# Patient Record
Sex: Female | Born: 1995
Health system: Southern US, Community
[De-identification: ages and names within clinical notes are randomized; demographics above are authoritative.]

## PROBLEM LIST (undated history)

## (undated) DIAGNOSIS — R7989 Other specified abnormal findings of blood chemistry: Secondary | ICD-10-CM

## (undated) DIAGNOSIS — E440 Moderate protein-calorie malnutrition: Secondary | ICD-10-CM

## (undated) DIAGNOSIS — R630 Anorexia: Secondary | ICD-10-CM

## (undated) DIAGNOSIS — B20 Human immunodeficiency virus [HIV] disease: Secondary | ICD-10-CM

## (undated) DIAGNOSIS — Z21 Asymptomatic human immunodeficiency virus [HIV] infection status: Secondary | ICD-10-CM

## (undated) DIAGNOSIS — R945 Abnormal results of liver function studies: Secondary | ICD-10-CM

## (undated) DIAGNOSIS — D649 Anemia, unspecified: Secondary | ICD-10-CM

## (undated) DIAGNOSIS — K8000 Calculus of gallbladder with acute cholecystitis without obstruction: Secondary | ICD-10-CM

## (undated) HISTORY — DX: Moderate protein-calorie malnutrition: E44.0

## (undated) HISTORY — PX: CYST REMOVAL HAND: SHX6279

## (undated) HISTORY — DX: Asymptomatic human immunodeficiency virus (hiv) infection status: Z21

## (undated) HISTORY — DX: Anorexia: R63.0

## (undated) HISTORY — DX: Calculus of gallbladder with acute cholecystitis without obstruction: K80.00

## (undated) HISTORY — DX: Human immunodeficiency virus (HIV) disease: B20

---

## 2017-06-21 ENCOUNTER — Encounter: Payer: Self-pay | Admitting: Emergency Medicine

## 2017-06-21 ENCOUNTER — Emergency Department
Admission: EM | Admit: 2017-06-21 | Discharge: 2017-06-21 | Disposition: A | Discharge status: Home / Self Care | Payer: Medicaid Other | Attending: Emergency Medicine | Admitting: Emergency Medicine

## 2017-06-21 DIAGNOSIS — R51 Headache: Secondary | ICD-10-CM | POA: Diagnosis present

## 2017-06-21 DIAGNOSIS — R6889 Other general symptoms and signs: Secondary | ICD-10-CM | POA: Diagnosis not present

## 2017-06-21 DIAGNOSIS — R110 Nausea: Secondary | ICD-10-CM | POA: Diagnosis not present

## 2017-06-21 LAB — COMPREHENSIVE METABOLIC PANEL
ALT: 14 U/L (ref 14–54)
ANION GAP: 8 (ref 5–15)
AST: 26 U/L (ref 15–41)
Albumin: 4.6 g/dL (ref 3.5–5.0)
Alkaline Phosphatase: 53 U/L (ref 38–126)
BUN: 8 mg/dL (ref 6–20)
CHLORIDE: 106 mmol/L (ref 101–111)
CO2: 24 mmol/L (ref 22–32)
Calcium: 9 mg/dL (ref 8.9–10.3)
Creatinine, Ser: 0.87 mg/dL (ref 0.44–1.00)
GFR calc non Af Amer: >60 mL/min (ref >60)
Glucose, Bld: 94 mg/dL (ref 65–99)
POTASSIUM: 3.7 mmol/L (ref 3.5–5.1)
SODIUM: 138 mmol/L (ref 135–145)
Total Bilirubin: 0.5 mg/dL (ref 0.3–1.2)
Total Protein: 8.9 g/dL — ABNORMAL HIGH (ref 6.5–8.1)

## 2017-06-21 LAB — CBC WITH DIFFERENTIAL/PLATELET
Basophils Absolute: 0 10*3/uL (ref 0–0.1)
Basophils Relative: 0 %
EOS ABS: 0 10*3/uL (ref 0–0.7)
EOS PCT: 0 %
HCT: 38.5 % (ref 35.0–47.0)
Hemoglobin: 13.2 g/dL (ref 12.0–16.0)
LYMPHS ABS: 1.4 10*3/uL (ref 1.0–3.6)
Lymphocytes Relative: 34 %
MCH: 28.5 pg (ref 26.0–34.0)
MCHC: 34.4 g/dL (ref 32.0–36.0)
MCV: 82.8 fL (ref 80.0–100.0)
Monocytes Absolute: 0.4 10*3/uL (ref 0.2–0.9)
Monocytes Relative: 9 %
Neutro Abs: 2.3 10*3/uL (ref 1.4–6.5)
Neutrophils Relative %: 57 %
PLATELETS: 156 10*3/uL (ref 150–440)
RBC: 4.65 MIL/uL (ref 3.80–5.20)
RDW: 13.4 % (ref 11.5–14.5)
WBC: 4.1 10*3/uL (ref 3.6–11.0)

## 2017-06-21 LAB — POCT PREGNANCY, URINE: PREG TEST UR: NEGATIVE

## 2017-06-21 LAB — MONONUCLEOSIS SCREEN: Mono Screen: NEGATIVE

## 2017-06-21 LAB — POCT RAPID STREP A: STREPTOCOCCUS, GROUP A SCREEN (DIRECT): NEGATIVE

## 2017-06-21 MED ORDER — KETOROLAC TROMETHAMINE 10 MG PO TABS: 10.0000 mg | ORAL_TABLET | Freq: Four times a day (QID) | ORAL | 0 refills | Status: DC | PRN

## 2017-06-21 MED ORDER — KETOROLAC TROMETHAMINE 30 MG/ML IJ SOLN
15.0000 mg | Freq: Once | INTRAMUSCULAR | Status: AC
  Administered 2017-06-21: 15 mg via INTRAVENOUS
  Filled 2017-06-21: qty 1

## 2017-06-21 MED ORDER — ONDANSETRON HCL 4 MG/2ML IJ SOLN
INTRAMUSCULAR | Status: AC
  Administered 2017-06-21: 4 mg via INTRAVENOUS
  Filled 2017-06-21: qty 2

## 2017-06-21 MED ORDER — SODIUM CHLORIDE 0.9 % IV BOLUS (SEPSIS)
1000.0000 mL | Freq: Once | INTRAVENOUS | Status: AC
  Administered 2017-06-21: 1000 mL via INTRAVENOUS

## 2017-06-21 MED ORDER — ONDANSETRON HCL 4 MG/2ML IJ SOLN
4.0000 mg | Freq: Once | INTRAMUSCULAR | Status: AC
  Administered 2017-06-21: 4 mg via INTRAVENOUS

## 2017-06-21 MED ORDER — ONDANSETRON 4 MG PO TBDP: 4.0000 mg | ORAL_TABLET | Freq: Three times a day (TID) | ORAL | 0 refills | Status: DC | PRN

## 2017-06-21 NOTE — ED Notes (Addendum)
Pt. States she has been having a migraine for about a week with generalized pain all over her body radiating to the back. Pt. Describes pain like pins and needles. Pt. c/o nausea. Zofran given.

## 2017-06-21 NOTE — Discharge Instructions (Signed)
You may take tylenol in addition to the prescription medications if needed for body aches or headache. Follow up with the primary care provider for symptoms that are not improving over the next few days. Return to the ER for symptoms that change or worsen if unable to schedule an appointment.

## 2017-06-21 NOTE — ED Triage Notes (Signed)
Pt c/o headache, generalized body aches and chills since Wednesday.  Pt also c/o slight cough. Pt taking ibuprofen OTC.  Denies any nausea, vomiting or light sensitivity at this time.

## 2017-06-21 NOTE — ED Provider Notes (Signed)
[facility] Emergency Department Provider Note  ____________________________________________  Time seen: Approximately 7:34 AM  I have reviewed the triage vital signs and the nursing notes.   HISTORY  Chief Complaint Generalized Body Aches and Headache   HPI Monica Henson is a 21 y.o. female who presents to the emergency department for treatment and evaluation of 5 days of headache, generalized body aches, chills, and low-grade fever intermittently. Yesterday, she developed a sore throat. Headache is temporal and occipital. She denies history of migraines. She has no significant past medical history. No relief of symptoms with ibuprofen. She stopped her OC 2 days ago at the recommendation by someone at work who told her it could be the cause of her headache. She denies visual changes.   History reviewed. No pertinent past medical history.  There are no active problems to display for this patient.   Past Surgical History:  Procedure Laterality Date  . CYST REMOVAL HAND      Prior to Admission medications   Medication Sig Start Date End Date Taking? Authorizing Provider  ketorolac (TORADOL) 10 MG tablet Take 1 tablet (10 mg total) by mouth every 6 (six) hours as needed. 06/21/17   Renesme Kerrigan B, FNP  ondansetron (ZOFRAN-ODT) 4 MG disintegrating tablet Take 1 tablet (4 mg total) by mouth every 8 (eight) hours as needed for nausea or vomiting. 06/21/17   Chinita Pester, FNP    Allergies Patient has no known allergies.  History reviewed. No pertinent family history.  Social History Social History  Substance Use Topics  . Smoking status: Current Every Day Smoker    Packs/day: 0.50    Types: Cigarettes  . Smokeless tobacco: Never Used  . Alcohol use Not on file    Review of Systems Constitutional: Positive for fever/chills ENT: Positive for sore throat. Cardiovascular: Denies chest pain. Respiratory: Negative for shortness of breath. Positive for  cough. Gastrointestinal: Positive for nausea,  Negative for vomiting.  Negative for diarrhea. Positive for constipation. Musculoskeletal: Positive for body aches Skin: Negative for rash. Neurological: Positive for headaches ____________________________________________   PHYSICAL EXAM:  VITAL SIGNS: ED Triage Vitals  Enc Vitals Group     BP 06/21/17 0719 102/62     Pulse Rate 06/21/17 0719 84     Resp 06/21/17 0719 16     Temp 06/21/17 0719 98.7 F (37.1 C)     Temp Source 06/21/17 0719 Oral     SpO2 06/21/17 0719 99 %     Weight 06/21/17 0720 140 lb (63.5 kg)     Height 06/21/17 0720 5\' 6"  (1.676 m)     Head Circumference --      Peak Flow --      Pain Score 06/21/17 0722 8     Pain Loc --      Pain Edu? --      Excl. in GC? --     Constitutional: Alert and oriented. Acutely ill appearing and in no acute distress. Eyes: Conjunctivae are normal. EOMI. Ears: Bilateral tympanic membranes are normal, intact, without erythema or loss of light reflex. Nose: No congestion noted; no rhinnorhea. Mouth/Throat: Mucous membranes are moist.  Oropharynx mildly erythematous. Tonsils are 1+ without exudate. Neck: No stridor.  Lymphatic: No cervical lymphadenopathy. Cardiovascular: Normal rate, regular rhythm. Good peripheral circulation. Respiratory: Normal respiratory effort.  No retractions. Breath sounds clear to auscultation throughout.. Gastrointestinal: Soft and nontender.  Musculoskeletal: FROM x 4 extremities.  Neurologic:  Normal speech and language.  Skin:  Skin is warm, dry and intact. No rash noted. Psychiatric: Mood and affect are normal. Speech and behavior are normal.  ____________________________________________   LABS (all labs ordered are listed, but only abnormal results are displayed)  Labs Reviewed  COMPREHENSIVE METABOLIC PANEL - Abnormal; Notable for the following:       Result Value   Total Protein 8.9 (*)    All other components within normal limits   CBC WITH DIFFERENTIAL/PLATELET  MONONUCLEOSIS SCREEN  POC URINE PREG, ED  POCT PREGNANCY, URINE  POCT RAPID STREP A   ____________________________________________  EKG  Not indicated ____________________________________________  RADIOLOGY  Not indicated ____________________________________________   PROCEDURES  Procedure(s) performed: None  Critical Care performed: No ____________________________________________   INITIAL IMPRESSION / ASSESSMENT AND PLAN / ED COURSE  21 year old female presenting to the emergency department for multiple medical complaints. Symptoms are likely viral. She will initially be treated with IV fluids and Zofran while awaiting results of lab studies.  ----------------------------------------- 9:06 AM on 06/21/2017 ----------------------------------------- Nausea relieved. Patient and mother updated on lab results. Mono screen still pending. This is likely a viral illness. She continues to complain of headache. She will be given toradol IV. ----------------------------------------- 9:58 AM on 06/21/2017 -----------------------------------------  Patient discharged home. She reported significant decrease in her headache after the Toradol. She had no return of nausea and had no vomiting while in the department. She will be given prescriptions for Zofran and Toradol. She was encouraged to follow-up with the primary care provider for choice for symptoms that are not improving over the next 2-3 days. She was instructed to return to the emergency department for symptoms that change or worsen if she is unable to schedule an appointment.  Pertinent labs & imaging results that were available during my care of the patient were reviewed by me and considered in my medical decision making (see chart for details).  Discharge Medication List as of 06/21/2017  9:48 AM    START taking these medications   Details  ketorolac (TORADOL) 10 MG tablet Take 1 tablet  (10 mg total) by mouth every 6 (six) hours as needed., Starting Sun 06/21/2017, Print    ondansetron (ZOFRAN-ODT) 4 MG disintegrating tablet Take 1 tablet (4 mg total) by mouth every 8 (eight) hours as needed for nausea or vomiting., Starting Sun 06/21/2017, Print        If controlled substance prescribed during this visit, 12 month history viewed on the [place] prior to issuing an initial prescription for Schedule II or III opiod. ____________________________________________   FINAL CLINICAL IMPRESSION(S) / ED DIAGNOSES  Final diagnoses:  Flu-like symptoms    Note:  This document was prepared using Dragon voice recognition software and may include unintentional dictation errors.     Chinita Pesterriplett, Daqwan Dougal B, FNP 06/21/17 [id number]    Phineas SemenGoodman, Graydon, MD 06/21/17 1122

## 2017-11-16 ENCOUNTER — Encounter: Payer: Self-pay | Admitting: Emergency Medicine

## 2017-11-16 ENCOUNTER — Emergency Department
Admission: EM | Admit: 2017-11-16 | Discharge: 2017-11-16 | Disposition: A | Discharge status: Home / Self Care | Payer: Medicaid Other | Attending: Emergency Medicine | Admitting: Emergency Medicine

## 2017-11-16 ENCOUNTER — Other Ambulatory Visit: Payer: Self-pay

## 2017-11-16 DIAGNOSIS — G44209 Tension-type headache, unspecified, not intractable: Secondary | ICD-10-CM | POA: Insufficient documentation

## 2017-11-16 DIAGNOSIS — R51 Headache: Secondary | ICD-10-CM | POA: Diagnosis present

## 2017-11-16 DIAGNOSIS — H73893 Other specified disorders of tympanic membrane, bilateral: Secondary | ICD-10-CM | POA: Insufficient documentation

## 2017-11-16 DIAGNOSIS — Z87891 Personal history of nicotine dependence: Secondary | ICD-10-CM | POA: Insufficient documentation

## 2017-11-16 DIAGNOSIS — Z79899 Other long term (current) drug therapy: Secondary | ICD-10-CM | POA: Diagnosis not present

## 2017-11-16 DIAGNOSIS — H6593 Unspecified nonsuppurative otitis media, bilateral: Secondary | ICD-10-CM

## 2017-11-16 MED ORDER — BUTALBITAL-APAP-CAFFEINE 50-325-40 MG PO TABS: 1.0000 | ORAL_TABLET | Freq: Two times a day (BID) | ORAL | 0 refills | Status: AC | PRN

## 2017-11-16 MED ORDER — PREDNISONE 50 MG PO TABS: ORAL_TABLET | ORAL | 0 refills | Status: DC

## 2017-11-16 NOTE — ED Provider Notes (Signed)
[facility] Emergency Department Provider Note  ____________________________________________  Time seen: Approximately 4:44 PM  I have reviewed the triage vital signs and the nursing notes.   HISTORY  Chief Complaint Headache and Facial Pain    HPI Monica Henson is a 22 y.o. female presents to the emergency department with occipital headache that has occurred for approximately 2 weeks.  Patient reports that she has experienced increased stress.  Patient recently moved from [place] to [place].  Patient reports that she has been spending increased time sleeping on her days off.  She denies a history of depression, suicidal ideation or homicidal ideation.  Patient secondarily reports muffled hearing from the right ear.  She denies rhinorrhea, congestion, nonproductive cough, chills, nausea, vomiting abdominal pain.  No alleviating measures of been attempted.   History reviewed. No pertinent past medical history.  There are no active problems to display for this patient.   Past Surgical History:  Procedure Laterality Date  . CYST REMOVAL HAND      Prior to Admission medications   Medication Sig Start Date End Date Taking? Authorizing Provider  butalbital-acetaminophen-caffeine (FIORICET, ESGIC) [phone] MG tablet Take 1-2 tablets by mouth 2 (two) times daily as needed for up to 10 days for headache. 11/16/17 11/26/17  Orvil Feil, PA-C  ketorolac (TORADOL) 10 MG tablet Take 1 tablet (10 mg total) by mouth every 6 (six) hours as needed. 06/21/17   Triplett, Cari B, FNP  ondansetron (ZOFRAN-ODT) 4 MG disintegrating tablet Take 1 tablet (4 mg total) by mouth every 8 (eight) hours as needed for nausea or vomiting. 06/21/17   Triplett, Cari B, FNP  predniSONE (DELTASONE) 50 MG tablet Take one 50 mg tablet once a day for 5 days. 11/16/17   Orvil Feil, PA-C    Allergies Patient has no known allergies.  History reviewed. No pertinent family history.  Social  History Social History   Tobacco Use  . Smoking status: Former Smoker    Packs/day: 0.50    Types: Cigarettes  . Smokeless tobacco: Never Used  Substance Use Topics  . Alcohol use: No    Frequency: Never  . Drug use: No     Review of Systems  Constitutional: No fever.  Eyes: No visual changes. No discharge ENT: Patient has muffled hearing, right.  Cardiovascular: no chest pain. Respiratory: no cough. No SOB. Gastrointestinal: No nausea.  Neurological: Patient has occipital headache.   ____________________________________________   PHYSICAL EXAM:  VITAL SIGNS: ED Triage Vitals  Enc Vitals Group     BP 11/16/17 1551 109/70     Pulse Rate 11/16/17 1553 87     Resp 11/16/17 1551 16     Temp 11/16/17 1551 100.1 F (37.8 C)     Temp Source 11/16/17 1551 Oral     SpO2 11/16/17 1553 98 %     Weight 11/16/17 1553 140 lb (63.5 kg)     Height 11/16/17 1553 5\' 6"  (1.676 m)     Head Circumference --      Peak Flow --      Pain Score 11/16/17 1553 7     Pain Loc --      Pain Edu? --      Excl. in GC? --      Constitutional: Patient is sitting in the chair when I walk in.  Patient laughs after each question. Eyes: Conjunctivae are normal. PERRL. EOMI. Head: Atraumatic.  Patient has no maxillary or frontal sinus tenderness. ENT:  Ears: TMs are effused bilaterally.      Nose: No congestion/rhinnorhea.      Mouth/Throat: Mucous membranes are moist.  Posterior pharynx is not erythematous.  Uvula is midline. Neck: No stridor.  No cervical spine tenderness to palpation.  Patient has no tenderness to palpation of cervical paraspinal muscles. Cardiovascular: Normal rate, regular rhythm. Normal S1 and S2.  Good peripheral circulation. Respiratory: Normal respiratory effort without tachypnea or retractions. Lungs CTAB. Good air entry to the bases with no decreased or absent breath sounds. Musculoskeletal: Full range of motion to all extremities. No gross deformities  appreciated.   ____________________________________________   LABS (all labs ordered are listed, but only abnormal results are displayed)  Labs Reviewed - No data to display ____________________________________________  EKG   ____________________________________________  RADIOLOGY  No results found.  ____________________________________________    PROCEDURES  Procedure(s) performed:    Procedures    Medications - No data to display   ____________________________________________   INITIAL IMPRESSION / ASSESSMENT AND PLAN / ED COURSE  Pertinent labs & imaging results that were available during my care of the patient were reviewed by me and considered in my medical decision making (see chart for details).  Review of the [place] CSRS was performed in accordance of the [facility] prior to dispensing any controlled drugs.    Assessment and plan Differential diagnosis includes tension headache and middle ear effusion. Patient presents to the emergency department with occipital headache and muffled hearing from the right ear.  On physical exam, patient had an effused right tympanic membrane.  History is consistent with tension headache.  Patient was discharged with Fioricet and prednisone.  Patient was advised against operating heavy machinery while taking Fioricet.  She was advised to follow-up with primary care as needed.  All patient questions were answered.    ____________________________________________  FINAL CLINICAL IMPRESSION(S) / ED DIAGNOSES  Final diagnoses:  Tension headache  Fluid level behind tympanic membrane of both ears      NEW MEDICATIONS STARTED DURING THIS VISIT:  ED Discharge Orders        Ordered    predniSONE (DELTASONE) 50 MG tablet     11/16/17 1635    butalbital-acetaminophen-caffeine (FIORICET, ESGIC) 50-325-40 MG tablet  2 times daily PRN     11/16/17 1635          This chart was dictated using voice recognition software/Dragon.  Despite best efforts to proofread, errors can occur which can change the meaning. Any change was purely unintentional.    Orvil FeilWoods, Jadira Nierman M, PA-C 11/16/17 1650    Jeanmarie PlantMcShane, James A, MD 11/16/17 2013

## 2017-11-16 NOTE — ED Notes (Signed)
See triage note  Presents with pain to right side of head   Describes as "tightness'   Also having some pressure to right ear and at her nose  No fever or drainage  afebrile on arrival

## 2017-11-16 NOTE — ED Triage Notes (Addendum)
Here for what pt thinks is sinus infection. Has had some nasal congestion and feels like something stuck in there can't get out.  Right ear also stopped up.  Pain to maxillary and frontal sinuses along with headache.  Reports does not have primary. Tried excedrin and it helps but then headache comes up. Appears well. Ambulatory. NAD. Low grade temp

## 2017-11-28 ENCOUNTER — Emergency Department: Payer: Medicaid Other

## 2017-11-28 ENCOUNTER — Other Ambulatory Visit: Payer: Self-pay

## 2017-11-28 ENCOUNTER — Encounter: Payer: Self-pay | Admitting: Emergency Medicine

## 2017-11-28 ENCOUNTER — Emergency Department
Admission: EM | Admit: 2017-11-28 | Discharge: 2017-11-28 | Disposition: A | Discharge status: Home / Self Care | Payer: Medicaid Other | Attending: Emergency Medicine | Admitting: Emergency Medicine

## 2017-11-28 DIAGNOSIS — M25551 Pain in right hip: Secondary | ICD-10-CM | POA: Diagnosis present

## 2017-11-28 DIAGNOSIS — M25552 Pain in left hip: Secondary | ICD-10-CM | POA: Insufficient documentation

## 2017-11-28 DIAGNOSIS — M25559 Pain in unspecified hip: Secondary | ICD-10-CM

## 2017-11-28 DIAGNOSIS — Z87891 Personal history of nicotine dependence: Secondary | ICD-10-CM | POA: Insufficient documentation

## 2017-11-28 LAB — URINALYSIS, COMPLETE (UACMP) WITH MICROSCOPIC
Bacteria, UA: NONE SEEN
Bilirubin Urine: NEGATIVE
GLUCOSE, UA: NEGATIVE mg/dL
Hgb urine dipstick: NEGATIVE
KETONES UR: NEGATIVE mg/dL
Leukocytes, UA: NEGATIVE
Nitrite: NEGATIVE
PH: 7 (ref 5.0–8.0)
Protein, ur: NEGATIVE mg/dL
Specific Gravity, Urine: 1.018 (ref 1.005–1.030)

## 2017-11-28 LAB — POCT PREGNANCY, URINE: Preg Test, Ur: NEGATIVE

## 2017-11-28 MED ORDER — ONDANSETRON 8 MG PO TBDP: 8.0000 mg | ORAL_TABLET | Freq: Once | ORAL | Status: DC

## 2017-11-28 MED ORDER — ONDANSETRON 4 MG PO TBDP
ORAL_TABLET | ORAL | Status: AC
  Administered 2017-11-28: 4 mg via ORAL
  Filled 2017-11-28: qty 1

## 2017-11-28 MED ORDER — ONDANSETRON 4 MG PO TBDP
4.0000 mg | ORAL_TABLET | Freq: Once | ORAL | Status: AC
  Administered 2017-11-28: 4 mg via ORAL

## 2017-11-28 MED ORDER — HYDROMORPHONE HCL 1 MG/ML IJ SOLN
1.0000 mg | Freq: Once | INTRAMUSCULAR | Status: AC
  Administered 2017-11-28: 1 mg via INTRAMUSCULAR
  Filled 2017-11-28: qty 1

## 2017-11-28 MED ORDER — CYCLOBENZAPRINE HCL 10 MG PO TABS: 10.0000 mg | ORAL_TABLET | Freq: Three times a day (TID) | ORAL | 0 refills | Status: DC | PRN

## 2017-11-28 MED ORDER — IBUPROFEN 600 MG PO TABS: 600.0000 mg | ORAL_TABLET | Freq: Three times a day (TID) | ORAL | 0 refills | Status: DC | PRN

## 2017-11-28 MED ORDER — KETOROLAC TROMETHAMINE 60 MG/2ML IM SOLN
30.0000 mg | Freq: Once | INTRAMUSCULAR | Status: AC
  Administered 2017-11-28: 30 mg via INTRAMUSCULAR
  Filled 2017-11-28: qty 2

## 2017-11-28 MED ORDER — TRAMADOL HCL 50 MG PO TABS: 50.0000 mg | ORAL_TABLET | Freq: Four times a day (QID) | ORAL | 0 refills | Status: DC | PRN

## 2017-11-28 NOTE — ED Notes (Signed)
Pt states her pain is better but she feels slightly nauseated now with medication, PA notified and order received for zofran.

## 2017-11-28 NOTE — ED Notes (Signed)
Friend here to drive pt home, pt reports feeling ready to go

## 2017-11-28 NOTE — ED Triage Notes (Signed)
Low back pain radiating to both legs began yesterday. Denies injury. Denies history of same.

## 2017-11-28 NOTE — ED Notes (Signed)
Pt states she just woke up yesterday with lower back pain that radiates into hips and down to knees, no known injury, denies any urinary symptoms, fever or other problems

## 2017-11-28 NOTE — ED Provider Notes (Signed)
[facility] Emergency Department Provider Note   ____________________________________________   None    (approximate)  I have reviewed the triage vital signs and the nursing notes.   HISTORY  Chief Complaint Back Pain    HPI Monica Henson is a 22 y.o. female patient complained of bilateral hip pain radiating to her knees.  Patient denies provocative incident for complaint.  Onset of a.m. awakening yesterday.  Patient rates pain as a 10/10.  Patient described the pain is "aching".  No pulses measured for complaint.  Patient said pain is less when she sits in the bed with the knees flexed.  Patient stated left lateral upper back increases the pain.  Patient denies bladder bowel dysfunction.  History reviewed. No pertinent past medical history.  There are no active problems to display for this patient.   Past Surgical History:  Procedure Laterality Date  . CYST REMOVAL HAND      Prior to Admission medications   Medication Sig Start Date End Date Taking? Authorizing Provider  cyclobenzaprine (FLEXERIL) 10 MG tablet Take 1 tablet (10 mg total) by mouth 3 (three) times daily as needed. 11/28/17   Joni Reining, PA-C  ibuprofen (ADVIL,MOTRIN) 600 MG tablet Take 1 tablet (600 mg total) by mouth every 8 (eight) hours as needed. 11/28/17   Joni Reining, PA-C  ketorolac (TORADOL) 10 MG tablet Take 1 tablet (10 mg total) by mouth every 6 (six) hours as needed. 06/21/17   Triplett, Cari B, FNP  ondansetron (ZOFRAN-ODT) 4 MG disintegrating tablet Take 1 tablet (4 mg total) by mouth every 8 (eight) hours as needed for nausea or vomiting. 06/21/17   Triplett, Cari B, FNP  predniSONE (DELTASONE) 50 MG tablet Take one 50 mg tablet once a day for 5 days. 11/16/17   Orvil Feil, PA-C  traMADol (ULTRAM) 50 MG tablet Take 1 tablet (50 mg total) by mouth every 6 (six) hours as needed for moderate pain. 11/28/17   Joni Reining, PA-C    Allergies Patient has no known  allergies.  No family history on file.  Social History Social History   Tobacco Use  . Smoking status: Former Smoker    Packs/day: 0.50    Types: Cigarettes  . Smokeless tobacco: Never Used  Substance Use Topics  . Alcohol use: No    Frequency: Never  . Drug use: No    Review of Systems Constitutional: No fever/chills Eyes: No visual changes. ENT: No sore throat. Cardiovascular: Denies chest pain. Respiratory: Denies shortness of breath. Gastrointestinal: No abdominal pain.  No nausea, no vomiting.  No diarrhea.  No constipation. Genitourinary: Negative for dysuria. Musculoskeletal: Bilateral hip and leg pain Skin: Negative for rash. Neurological: Negative for headaches, focal weakness or numbness.   ____________________________________________   PHYSICAL EXAM:  VITAL SIGNS: ED Triage Vitals  Enc Vitals Group     BP 11/28/17 0944 115/83     Pulse Rate 11/28/17 0944 97     Resp 11/28/17 0944 (!) 8     Temp 11/28/17 0944 98.5 F (36.9 C)     Temp Source 11/28/17 0944 Oral     SpO2 11/28/17 0944 100 %     Weight 11/28/17 0945 140 lb (63.5 kg)     Height 11/28/17 0945 5\' 6"  (1.676 m)     Head Circumference --      Peak Flow --      Pain Score 11/28/17 0945 10     Pain Loc --  Pain Edu? --      Excl. in GC? --     Constitutional: Alert and oriented. Well appearing and in no acute distress. Cardiovascular: Normal rate, regular rhythm. Grossly normal heart sounds.  Good peripheral circulation. Respiratory: Normal respiratory effort.  No retractions. Lungs CTAB. Gastrointestinal: Soft and nontender. No distention. No abdominal bruits. No CVA tenderness. Musculoskeletal: No obvious hip deformity.  No leg length discrepancy.  No lower extremity tenderness nor edema.  No joint effusions. Neurologic:  Normal speech and language. No gross focal neurologic deficits are appreciated. No gait instability. Skin:  Skin is warm, dry and intact. No rash  noted. Psychiatric: Mood and affect are normal. Speech and behavior are normal.  ____________________________________________   LABS (all labs ordered are listed, but only abnormal results are displayed)  Labs Reviewed  URINALYSIS, COMPLETE (UACMP) WITH MICROSCOPIC - Abnormal; Notable for the following components:      Result Value   Color, Urine YELLOW (*)    APPearance HAZY (*)    Squamous Epithelial / LPF 0-5 (*)    All other components within normal limits  POCT PREGNANCY, URINE  POC URINE PREG, ED   ____________________________________________  EKG   ____________________________________________  RADIOLOGY  ED MD interpretation: No acute findings on x-ray of the pelvic.  Official radiology report(s): Dg Pelvis 1-2 Views  Result Date: 11/28/2017 CLINICAL DATA:  22 year old female with sacral pain. No known injury. EXAM: PELVIS - 1-2 VIEW COMPARISON:  None. FINDINGS: Transitional anatomy on the left with partial sacralization of L5. Sacroiliac joints appear unremarkable. No evidence of fracture or malalignment. Normal bony mineralization. No lytic or blastic osseous lesion. The visualized bowel gas pattern is unremarkable. IMPRESSION: 1. No acute abnormality. 2. 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 Electronically Signed   By: Malachy MoanHeath  McCullough M.D.   On: 11/28/2017 11:30    ____________________________________________   PROCEDURES  Procedure(s) performed: None  Procedures  Critical Care performed: No  ____________________________________________   INITIAL IMPRESSION / ASSESSMENT AND PLAN / ED COURSE  As part of my medical decision making, I reviewed the following data within the electronic MEDICAL RECORD NUMBER    Muscle skeletal pain of the pelvic.  Discussed negative x-ray findings with patient.  Take medication as directed.  Patient given discharge care instructions and advised [facility]      ____________________________________________   FINAL CLINICAL IMPRESSION(S) / ED DIAGNOSES  Final diagnoses:  Hip pain, bilateral     ED Discharge Orders        Ordered    traMADol (ULTRAM) 50 MG tablet  Every 6 hours PRN     11/28/17 1153    cyclobenzaprine (FLEXERIL) 10 MG tablet  3 times daily PRN     11/28/17 1153    ibuprofen (ADVIL,MOTRIN) 600 MG tablet  Every 8 hours PRN     11/28/17 1153       Note:  This document was prepared using Dragon voice recognition software and may include unintentional dictation errors.    Joni ReiningSmith, Arrow Emmerich K, PA-C 11/28/17 1158    Governor RooksLord, Rebecca, MD 11/28/17 1335

## 2017-11-30 ENCOUNTER — Other Ambulatory Visit: Payer: Self-pay

## 2017-11-30 ENCOUNTER — Emergency Department
Admission: EM | Admit: 2017-11-30 | Discharge: 2017-11-30 | Disposition: A | Discharge status: Home / Self Care | Payer: Medicaid Other | Attending: Emergency Medicine | Admitting: Emergency Medicine

## 2017-11-30 DIAGNOSIS — M25551 Pain in right hip: Secondary | ICD-10-CM | POA: Diagnosis not present

## 2017-11-30 DIAGNOSIS — M25552 Pain in left hip: Secondary | ICD-10-CM | POA: Diagnosis not present

## 2017-11-30 DIAGNOSIS — R112 Nausea with vomiting, unspecified: Secondary | ICD-10-CM | POA: Insufficient documentation

## 2017-11-30 DIAGNOSIS — Z5321 Procedure and treatment not carried out due to patient leaving prior to being seen by health care provider: Secondary | ICD-10-CM | POA: Diagnosis not present

## 2017-11-30 LAB — CBC
HEMATOCRIT: 33.2 % — AB (ref 35.0–47.0)
HEMOGLOBIN: 10.9 g/dL — AB (ref 12.0–16.0)
MCH: 26.6 pg (ref 26.0–34.0)
MCHC: 32.9 g/dL (ref 32.0–36.0)
MCV: 80.8 fL (ref 80.0–100.0)
Platelets: 140 10*3/uL — ABNORMAL LOW (ref 150–440)
RBC: 4.11 MIL/uL (ref 3.80–5.20)
RDW: 12.2 % (ref 11.5–14.5)
WBC: 2.8 10*3/uL — ABNORMAL LOW (ref 3.6–11.0)

## 2017-11-30 LAB — BASIC METABOLIC PANEL
Anion gap: 7 (ref 5–15)
BUN: 11 mg/dL (ref 6–20)
CHLORIDE: 102 mmol/L (ref 101–111)
CO2: 27 mmol/L (ref 22–32)
Calcium: 8.6 mg/dL — ABNORMAL LOW (ref 8.9–10.3)
Creatinine, Ser: 0.77 mg/dL (ref 0.44–1.00)
GFR calc Af Amer: >60 mL/min (ref >60)
GFR calc non Af Amer: >60 mL/min (ref >60)
Glucose, Bld: 89 mg/dL (ref 65–99)
POTASSIUM: 3.2 mmol/L — AB (ref 3.5–5.1)
SODIUM: 136 mmol/L (ref 135–145)

## 2017-11-30 MED ORDER — ACETAMINOPHEN 325 MG PO TABS: 650.0000 mg | ORAL_TABLET | Freq: Once | ORAL | Status: DC | PRN

## 2017-11-30 NOTE — ED Notes (Signed)
Pt called in waiting room with no answer.  

## 2017-11-30 NOTE — ED Triage Notes (Signed)
Pt c/o bilateral hip pain for which she was seen and treated x 2 days ago in ED. Pt does not have PCP or insurance and states she was unable to follow up. Pt states the medcations that were rx'd for her were not helping the pain and were making her nauseated and vomit. Pt staes her hips hurt most when she lays down. Pt states leasst amount of pain when walking.

## 2017-11-30 NOTE — ED Notes (Signed)
Pt called with no answer

## 2017-11-30 NOTE — ED Notes (Signed)
Called pt to inform her of Cari-Beth, PA's concerns with abnormal lab value and recommendation she return to the ED for evaluation after LWBS, but got no answer at the only number provided. HIPPA complaint message left for pt to return call to ED ASAP.

## 2017-12-01 ENCOUNTER — Encounter: Payer: Self-pay | Admitting: Emergency Medicine

## 2017-12-01 ENCOUNTER — Telehealth: Payer: Self-pay | Admitting: Emergency Medicine

## 2017-12-01 ENCOUNTER — Emergency Department
Admission: EM | Admit: 2017-12-01 | Discharge: 2017-12-01 | Disposition: A | Discharge status: Home / Self Care | Payer: Medicaid Other | Attending: Emergency Medicine | Admitting: Emergency Medicine

## 2017-12-01 ENCOUNTER — Emergency Department: Payer: Medicaid Other

## 2017-12-01 DIAGNOSIS — Z87891 Personal history of nicotine dependence: Secondary | ICD-10-CM | POA: Diagnosis not present

## 2017-12-01 DIAGNOSIS — Z79899 Other long term (current) drug therapy: Secondary | ICD-10-CM | POA: Diagnosis not present

## 2017-12-01 DIAGNOSIS — M5442 Lumbago with sciatica, left side: Secondary | ICD-10-CM | POA: Diagnosis not present

## 2017-12-01 DIAGNOSIS — D72819 Decreased white blood cell count, unspecified: Secondary | ICD-10-CM

## 2017-12-01 DIAGNOSIS — M5441 Lumbago with sciatica, right side: Secondary | ICD-10-CM | POA: Diagnosis not present

## 2017-12-01 LAB — CBC WITH DIFFERENTIAL/PLATELET
BASOS PCT: 0 %
Basophils Absolute: 0 10*3/uL (ref 0–0.1)
EOS ABS: 0 10*3/uL (ref 0–0.7)
Eosinophils Relative: 0 %
HEMATOCRIT: 31.3 % — AB (ref 35.0–47.0)
Hemoglobin: 10.3 g/dL — ABNORMAL LOW (ref 12.0–16.0)
Lymphocytes Relative: 34 %
Lymphs Abs: 1 10*3/uL (ref 1.0–3.6)
MCH: 26.5 pg (ref 26.0–34.0)
MCHC: 33 g/dL (ref 32.0–36.0)
MCV: 80.4 fL (ref 80.0–100.0)
MONOS PCT: 12 %
Monocytes Absolute: 0.4 10*3/uL (ref 0.2–0.9)
NEUTROS ABS: 1.6 10*3/uL (ref 1.4–6.5)
Neutrophils Relative %: 54 %
Platelets: 130 10*3/uL — ABNORMAL LOW (ref 150–440)
RBC: 3.89 MIL/uL (ref 3.80–5.20)
RDW: 12.5 % (ref 11.5–14.5)
WBC: 3 10*3/uL — ABNORMAL LOW (ref 3.6–11.0)

## 2017-12-01 LAB — INFLUENZA PANEL BY PCR (TYPE A & B)
INFLAPCR: NEGATIVE
Influenza B By PCR: NEGATIVE

## 2017-12-01 LAB — SEDIMENTATION RATE: Sed Rate: 61 mm/hr — ABNORMAL HIGH (ref 0–20)

## 2017-12-01 MED ORDER — GADOBENATE DIMEGLUMINE 529 MG/ML IV SOLN
10.0000 mL | Freq: Once | INTRAVENOUS | Status: AC | PRN
  Administered 2017-12-01: 10 mL via INTRAVENOUS

## 2017-12-01 NOTE — ED Notes (Signed)
Pt back in room from MRI.

## 2017-12-01 NOTE — ED Notes (Signed)
Pt ambulatory upon discharge with significant other. Verbalized understanding of discharge instructions, follow-up care and pain management. A&O x4. VSS. Skin warm and dry.

## 2017-12-01 NOTE — ED Triage Notes (Signed)
Pt reports that she was here last night and left before being seen. She states that she was having bilat hip pain that radiated to her thighs and knees. States that pain is not as bad today. But was told to come back today by Marisue IvanLiz states that she said her WBC was low.

## 2017-12-01 NOTE — Discharge Instructions (Addendum)
Your workup in the Emergency Department today was reassuring.  We did not find any specific abnormalities.  We recommend you drink plenty of fluids, take your regular medications and/or any new ones prescribed today, and follow up with the doctor(s) listed in these documents as recommended.  Return to the Emergency Department if you develop new or worsening symptoms that concern you.  

## 2017-12-01 NOTE — Telephone Encounter (Addendum)
Called patient due to lwot to inquire about condition and follow up plans.  Left message asking her to call me.  There are concerns regarding her lab work.  RN already called and left message last night with no response, so if she does not call me back today,i will send letter.   The patient called me back, and I explained abnormal labs and my concern that she be seen.  She does not have pcp or insurance, but agrees to return here.

## 2017-12-01 NOTE — ED Provider Notes (Signed)
[facility] Emergency Department Provider Note  ____________________________________________   First MD Initiated Contact with Patient 12/01/17 1349     (approximate)  I have reviewed the triage vital signs and the nursing notes.   HISTORY  Chief Complaint Abnormal Lab    HPI Monica Henson is a 22 y.o. female with no specific chronic past medical history but who has been to the ED multiple times in the last 6 months for a variety of complaints.  She presents today because she was called and told to come back and due to a low white blood cell count yesterday.  She was seen a few days ago and flex for bilateral low back pain radiating into bilateral lower extremities.  She was treated as musculoskeletal/orthopedic pain and discharged.  She came back yesterday for further evaluation for ongoing pain but left without being seen.  Lab work was obtained in triage and her white blood cell count was less than 3.  Kris Mouton called her and suggested she come back due to concerns about her leukopenia.  The patient states she actually feels better today than she did yesterday but she still has some persistent pain in both sides of her lower back and radiating down into her legs, both anterior and posterior aspects of the legs.  No numbness and tingling but persistent discomfort, more of an aching pain.  She has not had any balance issues or difficulty with ambulation except secondary to the pain in her back and legs.  She denies fever/chills, chest pain, shortness of breath, nausea, vomiting, abdominal pain, and dysuria.  She has not had any traumatic injuries or strains of which she is aware.  She initially had some nausea and vomiting after getting an injection of medicine during her prior visit, but that has improved.  She reports that she is having trouble sleeping because she is always uncomfortable and cannot find a good position of comfort, but again her symptoms today are  mild although at times they have been severe.  The symptoms are all new over the course of the last week.  Of note, yesterday she had a low-grade fever of 100.9 and about 2 weeks prior to that she has a documented temperature of 100.1.  History reviewed. No pertinent past medical history.  There are no active problems to display for this patient.   Past Surgical History:  Procedure Laterality Date  . CYST REMOVAL HAND      Prior to Admission medications   Medication Sig Start Date End Date Taking? Authorizing Provider  cyclobenzaprine (FLEXERIL) 10 MG tablet Take 1 tablet (10 mg total) by mouth 3 (three) times daily as needed. 11/28/17   Sable Feil, PA-C  ibuprofen (ADVIL,MOTRIN) 600 MG tablet Take 1 tablet (600 mg total) by mouth every 8 (eight) hours as needed. 11/28/17   Sable Feil, PA-C  ketorolac (TORADOL) 10 MG tablet Take 1 tablet (10 mg total) by mouth every 6 (six) hours as needed. 06/21/17   Triplett, Cari B, FNP  ondansetron (ZOFRAN-ODT) 4 MG disintegrating tablet Take 1 tablet (4 mg total) by mouth every 8 (eight) hours as needed for nausea or vomiting. 06/21/17   Triplett, Cari B, FNP  predniSONE (DELTASONE) 50 MG tablet Take one 50 mg tablet once a day for 5 days. 11/16/17   Lannie Fields, PA-C  traMADol (ULTRAM) 50 MG tablet Take 1 tablet (50 mg total) by mouth every 6 (six) hours as needed for moderate pain.  11/28/17   Sable Feil, PA-C    Allergies Patient has no known allergies.  History reviewed. No pertinent family history.  Social History Social History   Tobacco Use  . Smoking status: Former Smoker    Packs/day: 0.50    Types: Cigarettes  . Smokeless tobacco: Never Used  Substance Use Topics  . Alcohol use: No    Frequency: Never  . Drug use: No    Review of Systems Constitutional: No subjective fever or chills, but low-grade fever has been measured in the emergency department in triage Eyes: No visual changes. ENT: No sore  throat. Cardiovascular: Denies chest pain. Respiratory: Denies shortness of breath. Gastrointestinal: No abdominal pain.  No nausea, no vomiting.  No diarrhea.  No constipation. Genitourinary: Negative for dysuria. Musculoskeletal: Low back pain for about a week on both sides of her low back and radiating down into both legs  integumentary: Negative for rash. Neurological: Negative for headaches, focal weakness or numbness. Psychiatric:Calm, cooperative, no complaints  ____________________________________________   PHYSICAL EXAM:  VITAL SIGNS: ED Triage Vitals  Enc Vitals Group     BP 12/01/17 1229 110/79     Pulse Rate 12/01/17 1229 93     Resp 12/01/17 1229 19     Temp 12/01/17 1229 99.5 F (37.5 C)     Temp Source 12/01/17 1229 Oral     SpO2 12/01/17 1229 97 %     Weight 12/01/17 1230 63.5 kg (140 lb)     Height 12/01/17 1230 1.676 m (_0 )     Head Circumference --      Peak Flow --      Pain Score 12/01/17 1230 4     Pain Loc --      Pain Edu? --      Excl. in [place]? --     Constitutional: Alert and oriented. Well appearing and in no acute distress. Eyes: Conjunctivae are normal.  Head: Atraumatic. Nose: No congestion/rhinnorhea. Mouth/Throat: Mucous membranes are moist. Neck: No stridor.  No meningeal signs.   Cardiovascular: Normal rate, regular rhythm. Good peripheral circulation. Grossly normal heart sounds. Respiratory: Normal respiratory effort.  No retractions. Lungs CTAB. Gastrointestinal: Soft and nontender. No distention.  Musculoskeletal: Musculoskeletal tenderness to palpation on both sides of the lower lumbar spine and the patient states that the pain radiates around to the front and down her legs.  No palpable fluctuance or induration and no erythema to suggest a superficial infection. Neurologic:  Normal speech and language. No gross focal neurologic deficits are appreciated.  No gait disturbances Skin:  Skin is warm, dry and intact. No rash  noted. Psychiatric: Mood and affect are normal. Speech and behavior are normal.  ____________________________________________   LABS (all labs ordered are listed, but only abnormal results are displayed)  Labs Reviewed  CBC WITH DIFFERENTIAL/PLATELET - Abnormal; Notable for the following components:      Result Value   WBC 3.0 (*)    Hemoglobin 10.3 (*)    HCT 31.3 (*)    Platelets 130 (*)    All other components within normal limits  SEDIMENTATION RATE - Abnormal; Notable for the following components:   Sed Rate 61 (*)    All other components within normal limits  INFLUENZA PANEL BY PCR (TYPE A & B)   ____________________________________________  EKG  None - EKG not ordered by ED physician ____________________________________________  RADIOLOGY   ED MD interpretation: No acute abnormalities identified on MRI of thoracic and lumbar spine  Official radiology report(s): Mr Thoracic Spine W Wo Contrast  Result Date: 12/01/2017 CLINICAL DATA:  22 year old female with back pain radiating to both legs. Abnormal ESR, leukopenia. Query discitis/osteomyelitis. EXAM: MRI THORACIC AND LUMBAR SPINE WITHOUT AND WITH CONTRAST TECHNIQUE: Multiplanar and multiecho pulse sequences of the thoracic and lumbar spine were obtained without and with intravenous contrast. CONTRAST:  61m MULTIHANCE GADOBENATE DIMEGLUMINE 529 MG/ML IV SOLN COMPARISON:  Pelvis radiograph 11/28/2017. FINDINGS: MRI THORACIC SPINE FINDINGS Limited cervical spine imaging:  Appears normal. Thoracic spine segmentation:  Appears normal. Alignment:  Normal thoracic vertebral height and alignment. Vertebrae: No marrow edema or evidence of acute osseous abnormality. Visualized bone marrow signal is within normal limits. No abnormal enhancement identified. Cord: Capacious thoracic spinal canal. Spinal cord signal is within normal limits at all visualized levels. Normal conus medullaris at T12-L1. No abnormal intradural enhancement.  No dural thickening. Paraspinal and other soft tissues: Negative visualized thoracic and upper abdominal viscera. Negative visualized posterior paraspinal soft tissues. Disc levels: Normal lower cervical spine and thoracic spine intervertebral disc signal and morphology, no thoracic disc herniation. Capacious thoracic spinal canal throughout, no spinal stenosis. Minimal lower thoracic facet hypertrophy at T10-T11. No thoracic neural foraminal stenosis. MRI LUMBAR SPINE FINDINGS Segmentation:  Normal, concordant with the thoracic spine numbering. Alignment:  Normal lumbar vertebral height and alignment. Vertebrae: Visualized bone marrow signal is within normal limits. No marrow edema or evidence of acute osseous abnormality. No abnormal enhancement identified. Intact visible sacrum and SI joints. Conus medullaris: Extends to the T12-L1 level and appears normal. No abnormal intradural enhancement. No lumbar dural thickening. Axial lumbar images are intermittently degraded by motion. The cauda equina appears within normal limits. Paraspinal and other soft tissues: Visualized abdominal viscera and paraspinal soft tissues are within normal limits. Disc levels: T12-L1:  Negative. L1-L2:  Negative. L2-L3: To mild facet hypertrophy. There is a sub-centimeter posteriorly situated synovial cyst on the right (series 12, image 5) which should not cause neural compromise. No stenosis. L3-L4:  Negative. L4-L5:  Negative. L5-S1:  Negative. IMPRESSION: Normal MRI appearance of the thoracic and lumbar spine. No acute or inflammatory process identified. No significant spinal degeneration, no spinal stenosis, or neural impingement. Electronically Signed   By: HGenevie AnnM.D.   On: 12/01/2017 19:56   Mr Lumbar Spine W Wo Contrast  Result Date: 12/01/2017 CLINICAL DATA:  22year old female with back pain radiating to both legs. Abnormal ESR, leukopenia. Query discitis/osteomyelitis. EXAM: MRI THORACIC AND LUMBAR SPINE WITHOUT AND WITH  CONTRAST TECHNIQUE: Multiplanar and multiecho pulse sequences of the thoracic and lumbar spine were obtained without and with intravenous contrast. CONTRAST:  130mMULTIHANCE GADOBENATE DIMEGLUMINE 529 MG/ML IV SOLN COMPARISON:  Pelvis radiograph 11/28/2017. FINDINGS: MRI THORACIC SPINE FINDINGS Limited cervical spine imaging:  Appears normal. Thoracic spine segmentation:  Appears normal. Alignment:  Normal thoracic vertebral height and alignment. Vertebrae: No marrow edema or evidence of acute osseous abnormality. Visualized bone marrow signal is within normal limits. No abnormal enhancement identified. Cord: Capacious thoracic spinal canal. Spinal cord signal is within normal limits at all visualized levels. Normal conus medullaris at T12-L1. No abnormal intradural enhancement. No dural thickening. Paraspinal and other soft tissues: Negative visualized thoracic and upper abdominal viscera. Negative visualized posterior paraspinal soft tissues. Disc levels: Normal lower cervical spine and thoracic spine intervertebral disc signal and morphology, no thoracic disc herniation. Capacious thoracic spinal canal throughout, no spinal stenosis. Minimal lower thoracic facet hypertrophy at T10-T11. No thoracic neural foraminal stenosis. MRI LUMBAR SPINE FINDINGS  Segmentation:  Normal, concordant with the thoracic spine numbering. Alignment:  Normal lumbar vertebral height and alignment. Vertebrae: Visualized bone marrow signal is within normal limits. No marrow edema or evidence of acute osseous abnormality. No abnormal enhancement identified. Intact visible sacrum and SI joints. Conus medullaris: Extends to the T12-L1 level and appears normal. No abnormal intradural enhancement. No lumbar dural thickening. Axial lumbar images are intermittently degraded by motion. The cauda equina appears within normal limits. Paraspinal and other soft tissues: Visualized abdominal viscera and paraspinal soft tissues are within normal  limits. Disc levels: T12-L1:  Negative. L1-L2:  Negative. L2-L3: To mild facet hypertrophy. There is a sub-centimeter posteriorly situated synovial cyst on the right (series 12, image 5) which should not cause neural compromise. No stenosis. L3-L4:  Negative. L4-L5:  Negative. L5-S1:  Negative. IMPRESSION: Normal MRI appearance of the thoracic and lumbar spine. No acute or inflammatory process identified. No significant spinal degeneration, no spinal stenosis, or neural impingement. Electronically Signed   By: Genevie Ann M.D.   On: 12/01/2017 19:56    ____________________________________________   PROCEDURES  Critical Care performed: No   Procedure(s) performed:   Procedures   ____________________________________________   INITIAL IMPRESSION / ASSESSMENT AND PLAN / ED COURSE  As part of my medical decision making, I reviewed the following data within the [facility] notes reviewed and incorporated, Labs reviewed , Old chart reviewed and Notes from prior ED visits    Differential diagnosis includes, but is not limited to, musculoskeletal pain/strain, low-grade infection of any possible source, autoimmune disorder such as lupus, infectious disease such as HIV, tickborne illness, neoplastic process, etc.  The leukopenia most likely is due to a chronic or subacute infectious or inflammatory process.  I rechecked a CBC with differential today and she once again has a low white blood cell count at 3.0.  I also checked a sedimentation rate which is elevated at about 60, approximately 3 times upper limit of normal but still relatively low.  I must consider the possibility of discitis/osteomyelitis or epidural abscess given that she is having low back pain with radicular symptoms in an otherwise healthy 22 year old woman with no other signs or symptoms except for low-grade fever and those as described above.  I discussed with her and we agreed to obtain an MRI of the  Thoracic  and lumbar spine with and without contrast to look for any evidence of infection but also to look for any sign of neoplasm.  I will reassess after the results of the imaging.  I reviewed her record from a couple of days ago and she had no sign of UTI.  She continues to have no abdominal tenderness and no respiratory symptoms.  Given the prevalence in the community I will check an influenza swab but I find this highly unlikely given no other symptoms. Clinical Course as of Dec 01 2102  Tue Dec 01, 2017  1905 Influenza A By PCR: NEGATIVE [CF]    Clinical Course User Index [CF] Hinda Kehr, MD    ____________________________________________  FINAL CLINICAL IMPRESSION(S) / ED DIAGNOSES  Final diagnoses:  Leukopenia, unspecified type  Acute bilateral low back pain with bilateral sciatica     MEDICATIONS GIVEN DURING THIS VISIT:  Medications  gadobenate dimeglumine (MULTIHANCE) injection 10 mL (10 mLs Intravenous Contrast Given 12/01/17 1939)     ED Discharge Orders    None       Note:  This document was prepared using Dragon voice recognition  software and may include unintentional dictation errors.    Hinda Kehr, MD 12/01/17 2104

## 2018-04-24 ENCOUNTER — Encounter: Payer: Self-pay | Admitting: Emergency Medicine

## 2018-04-24 ENCOUNTER — Other Ambulatory Visit: Payer: Self-pay

## 2018-04-24 ENCOUNTER — Emergency Department
Admission: EM | Admit: 2018-04-24 | Discharge: 2018-04-24 | Disposition: A | Discharge status: Home / Self Care | Payer: Medicaid Other | Attending: Emergency Medicine | Admitting: Emergency Medicine

## 2018-04-24 DIAGNOSIS — G43009 Migraine without aura, not intractable, without status migrainosus: Secondary | ICD-10-CM | POA: Insufficient documentation

## 2018-04-24 DIAGNOSIS — Z87891 Personal history of nicotine dependence: Secondary | ICD-10-CM | POA: Insufficient documentation

## 2018-04-24 DIAGNOSIS — Z79899 Other long term (current) drug therapy: Secondary | ICD-10-CM | POA: Insufficient documentation

## 2018-04-24 MED ORDER — PROMETHAZINE HCL 25 MG/ML IJ SOLN
25.0000 mg | Freq: Once | INTRAMUSCULAR | Status: AC
  Administered 2018-04-24: 25 mg via INTRAMUSCULAR
  Filled 2018-04-24: qty 1

## 2018-04-24 MED ORDER — KETOROLAC TROMETHAMINE 30 MG/ML IJ SOLN
30.0000 mg | Freq: Once | INTRAMUSCULAR | Status: AC
  Administered 2018-04-24: 30 mg via INTRAMUSCULAR
  Filled 2018-04-24: qty 1

## 2018-04-24 MED ORDER — DIPHENHYDRAMINE HCL 50 MG/ML IJ SOLN
50.0000 mg | Freq: Once | INTRAMUSCULAR | Status: AC
  Administered 2018-04-24: 50 mg via INTRAMUSCULAR
  Filled 2018-04-24: qty 1

## 2018-04-24 MED ORDER — BUTALBITAL-APAP-CAFFEINE 50-325-40 MG PO TABS: 1.0000 | ORAL_TABLET | Freq: Four times a day (QID) | ORAL | 0 refills | Status: DC | PRN

## 2018-04-24 NOTE — ED Triage Notes (Signed)
Bilateral headache x 6 days. Bilateral ear aches.slight sinus drainage. No head injury. Doesn't us blood thinners.

## 2018-04-24 NOTE — ED Provider Notes (Signed)
[facility] Emergency Department Provider Note  ____________________________________________  Time seen: Approximately 4:22 PM  I have reviewed the triage vital signs and the nursing notes.   HISTORY  Chief Complaint Headache    HPI Monica Henson is a 22 y.o. female who presents the emergency department complaining of headache x6 days.  Patient reports that she has a history of migraines and this feels similar, however her duration is longer than normal.  Patient denies any recent head trauma.  She denies any visual changes.  She reports that the headache radiates from the temporal region into the occipital region of her head.  Patient reports that she has had no URI symptoms.  No nasal congestion, sore throat, coughing.  Patient denies any neck pain or stiffness, chest pain, shortness of breath, abdominal pain, nausea or vomiting.  Patient reports that she has never been prescribed medications for her migraine headache.  She has intermittently use Tylenol and/or Motrin at home for the headache over the past week.  No other complaints at this time.  No other medications prior to arrival.    History reviewed. No pertinent past medical history.  There are no active problems to display for this patient.   Past Surgical History:  Procedure Laterality Date  . CYST REMOVAL HAND      Prior to Admission medications   Medication Sig Start Date End Date Taking? Authorizing Provider  butalbital-acetaminophen-caffeine (FIORICET, ESGIC) 50-325-40 MG tablet Take 1 tablet by mouth every 6 (six) hours as needed for headache. 04/24/18 04/24/19  Kohei Antonellis, Delorise Royals, PA-C  cyclobenzaprine (FLEXERIL) 10 MG tablet Take 1 tablet (10 mg total) by mouth 3 (three) times daily as needed. 11/28/17   Joni Reining, PA-C  ibuprofen (ADVIL,MOTRIN) 600 MG tablet Take 1 tablet (600 mg total) by mouth every 8 (eight) hours as needed. 11/28/17   Joni Reining, PA-C  ketorolac (TORADOL) 10 MG  tablet Take 1 tablet (10 mg total) by mouth every 6 (six) hours as needed. 06/21/17   Triplett, Cari B, FNP  ondansetron (ZOFRAN-ODT) 4 MG disintegrating tablet Take 1 tablet (4 mg total) by mouth every 8 (eight) hours as needed for nausea or vomiting. 06/21/17   Triplett, Cari B, FNP  predniSONE (DELTASONE) 50 MG tablet Take one 50 mg tablet once a day for 5 days. 11/16/17   Orvil Feil, PA-C  traMADol (ULTRAM) 50 MG tablet Take 1 tablet (50 mg total) by mouth every 6 (six) hours as needed for moderate pain. 11/28/17   Joni Reining, PA-C    Allergies Patient has no known allergies.  No family history on file.  Social History Social History   Tobacco Use  . Smoking status: Former Smoker    Packs/day: 0.50    Types: Cigarettes  . Smokeless tobacco: Never Used  Substance Use Topics  . Alcohol use: No    Frequency: Never  . Drug use: No     Review of Systems  Constitutional: No fever/chills Eyes: No visual changes.  Cardiovascular: no chest pain. Respiratory: no cough. No SOB. Gastrointestinal: No abdominal pain.  No nausea, no vomiting.   Musculoskeletal: Negative for musculoskeletal pain. Skin: Negative for rash, abrasions, lacerations, ecchymosis. Neurological: Positive for headache but denies focal weakness or numbness. 10-point ROS otherwise negative.  ____________________________________________   PHYSICAL EXAM:  VITAL SIGNS: ED Triage Vitals  Enc Vitals Group     BP 04/24/18 1527 112/78     Pulse Rate 04/24/18 1527 94  Resp 04/24/18 1527 18     Temp 04/24/18 1527 100 F (37.8 C)     Temp Source 04/24/18 1527 Oral     SpO2 04/24/18 1527 100 %     Weight 04/24/18 1528 135 lb (61.2 kg)     Height 04/24/18 1528 5\' 6"  (1.676 m)     Head Circumference --      Peak Flow --      Pain Score 04/24/18 1528 9     Pain Loc --      Pain Edu? --      Excl. in [place]? --      Constitutional: Alert and oriented. Well appearing and in no acute distress. Eyes:  Conjunctivae are normal. PERRL. EOMI. Head: Atraumatic.   ENT:      Ears:       Nose: No congestion/rhinnorhea.      Mouth/Throat: Mucous membranes are moist.  Neck: No stridor.  No cervical spine tenderness to palpation.  Neck is supple full range of motion. Hematological/Lymphatic/Immunilogical: No cervical lymphadenopathy. Cardiovascular: Normal rate, regular rhythm. Normal S1 and S2.  Good peripheral circulation. Respiratory: Normal respiratory effort without tachypnea or retractions. Lungs CTAB. Good air entry to the bases with no decreased or absent breath sounds. Musculoskeletal: Full range of motion to all extremities. No gross deformities appreciated. Neurologic:  Normal speech and language. No gross focal neurologic deficits are appreciated.  Cranial nerves II through XII grossly intact.  Negative pronator drift.  Negative Romberg's. Skin:  Skin is warm, dry and intact. No rash noted. Psychiatric: Mood and affect are normal. Speech and behavior are normal. Patient exhibits appropriate insight and judgement.   ____________________________________________   LABS (all labs ordered are listed, but only abnormal results are displayed)  Labs Reviewed - No data to display ____________________________________________  EKG   ____________________________________________  RADIOLOGY   No results found.  ____________________________________________    PROCEDURES  Procedure(s) performed:    Procedures    Medications  ketorolac (TORADOL) 30 MG/ML injection 30 mg (has no administration in time range)  promethazine (PHENERGAN) injection 25 mg (has no administration in time range)  diphenhydrAMINE (BENADRYL) injection 50 mg (has no administration in time range)      ____________________________________________   INITIAL IMPRESSION / ASSESSMENT AND PLAN / ED COURSE  Pertinent labs & imaging results that were available during my care of the patient were reviewed by me  and considered in my medical decision making (see chart for details).  Review of the [place] CSRS was performed in accordance of the [facility] prior to dispensing any controlled drugs.      Patient's diagnosis is consistent with migraine headache.  Patient presents the emergency department with a history of migraines.  Headache feels consistent with previous migraines but it lasted longer than normal.  No recent injury.  No concerning symptoms warranting further investigation with imaging at this time.  Patient is given migraine cocktail injections in the emergency department for symptom relief.. Patient will be discharged home with prescriptions for Fioricet for return to migraine. Patient is to follow up with primary care as needed or otherwise directed. Patient is given ED precautions to return to the ED for any worsening or new symptoms.     ____________________________________________  FINAL CLINICAL IMPRESSION(S) / ED DIAGNOSES  Final diagnoses:  Migraine without aura and without status migrainosus, not intractable      NEW MEDICATIONS STARTED DURING THIS VISIT:  ED Discharge Orders        Ordered  butalbital-acetaminophen-caffeine (FIORICET, ESGIC) 50-325-40 MG tablet  Every 6 hours PRN     04/24/18 1630          This chart was dictated using voice recognition software/Dragon. Despite best efforts to proofread, errors can occur which can change the meaning. Any change was purely unintentional.    Racheal PatchesCuthriell, Hazel Wrinkle D, PA-C 04/24/18 1631    Phineas SemenGoodman, [place], MD 04/24/18 1728

## 2018-05-06 ENCOUNTER — Telehealth: Payer: Self-pay | Admitting: Behavioral Health

## 2018-05-06 NOTE — Telephone Encounter (Signed)
Dr. Omer JackMumaw called to follow up on an Urgent referral that was placed yesterday.  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  She states patient needs to be seen right away.  Informed her I needed to speak to the provider and staff to follow up on the referral.   Called patient to see how she is feeling.  Patient states she has been ill for about a month but she was ok at the moment. Patient states her Dr. Jeanene Erballed her and informed her she was positive for HIV. Spoke to front office staff to schedule patient for new [facility] appointments.  Patient was called and made aware of appointments by front office staff.  Per Marcos EkeGreg Calone NP if patient needs to be seen tomorrow so symptoms she can. Angeline SlimAshley Shabre Kreher RN   Called Dr. Richardson LandryMumaws office left a message to let them know patient is scheduled in our office to start the new patient process. Angeline SlimAshley Shauntia Levengood RN

## 2018-05-07 ENCOUNTER — Ambulatory Visit (INDEPENDENT_AMBULATORY_CARE_PROVIDER_SITE_OTHER): Payer: Self-pay | Admitting: Licensed Clinical Social Worker

## 2018-05-07 ENCOUNTER — Encounter: Payer: Self-pay | Admitting: Family

## 2018-05-07 ENCOUNTER — Other Ambulatory Visit: Payer: Medicaid Other

## 2018-05-07 ENCOUNTER — Ambulatory Visit: Payer: Self-pay

## 2018-05-07 ENCOUNTER — Ambulatory Visit (INDEPENDENT_AMBULATORY_CARE_PROVIDER_SITE_OTHER): Payer: Self-pay | Admitting: Family

## 2018-05-07 VITALS — BP 110/73 | HR 102 | Temp 98.2°F | Wt 117.0 lb

## 2018-05-07 DIAGNOSIS — F4321 Adjustment disorder with depressed mood: Secondary | ICD-10-CM

## 2018-05-07 DIAGNOSIS — B20 Human immunodeficiency virus [HIV] disease: Secondary | ICD-10-CM | POA: Insufficient documentation

## 2018-05-07 MED ORDER — BICTEGRAVIR-EMTRICITAB-TENOFOV 50-200-25 MG PO TABS: 1.0000 | ORAL_TABLET | Freq: Every day | ORAL | 2 refills | Status: DC

## 2018-05-07 MED ORDER — SULFAMETHOXAZOLE-TRIMETHOPRIM 400-80 MG PO TABS: 1.0000 | ORAL_TABLET | Freq: Two times a day (BID) | ORAL | 1 refills | Status: DC

## 2018-05-07 MED ORDER — SULFAMETHOXAZOLE-TRIMETHOPRIM 400-80 MG PO TABS: 1.0000 | ORAL_TABLET | Freq: Every day | ORAL | 1 refills | Status: DC

## 2018-05-07 MED FILL — BIKTARVY 50-200-25 MG TABS: 50-200-25 | 30 days supply | Qty: 30 | Fill #0

## 2018-05-07 NOTE — BH Specialist Note (Signed)
Integrated Behavioral Health Initial Visit  MRN: [id number] Name: Monica Henson  Number of Integrated Behavioral Health Clinician visits:: 1/6 Session Start time: 10:42am  Session End time: 10:59am Total time: 15 minutes  Type of Service: Integrated Behavioral Health- Individual/Family Interpretor:No. Interpretor Name and Language: n/a   Warm Hand Off Completed.       SUBJECTIVE: Monica Henson is a 22 y.o. female accompanied by self Patient was referred by Marcos Eke for adjustment to HIV diagnosis. Patient reports the following symptoms/concerns: sadness, shock, dealing with physical illness, somedays feels very down Duration of problem:1 month; Severity of problem: moderate  OBJECTIVE: Mood: Depressed and Affect: Blunt Risk of harm to self or others: No plan to harm self or others  LIFE CONTEXT: Patient reports that she has been sick for about a month and thought that her symptoms were indicative of gall bladder problems. She went to see her obgyn last week (only has family planning insurance) and they ran a panel of bloodwork which came back negative for everything but HIV. Patient had no idea that she had been exposed to HIV, but from the symptoms and levels was told exposure was probably a few months back. She will be starting medication today, and is very hopeful that it will help with her physical illness, as it is hard to deal with emotional/mental adjustment when she is in so much physical pain and discomfort.  Patient has been working two jobs, but has been unable to work lately and expects to lose the new job, while her boss from the one she's been at for a while will work with her on taking medical leave. She has been living on her own, but yesterday decided to move back in with her mother for a couple of months. Patient has told her mother that she is HIV+, but has not spoken to her father in 2 years. She had not told her brother, because he has contact with their father and  would tell him. Mother asked if patient wanted brother to know, and patient said not to tell him but mother did anyway. Patient is concerned that he has told her father, or will, and the news will travel around that half of the family.   GOALS ADDRESSED: Patient will: 1. Reduce symptoms of: depression  2. Increase level of healthy adjustment to current life circumstances.  INTERVENTIONS: Interventions utilized: Motivational Interviewing and Supportive Counseling    ASSESSMENT: Patient currently experiencing sadness and shock about HIV diagnosis, some days of depressed mood, insomnia, fatigue. She presents with blunted affect, soft speech and some nervousness. Patient reports a history of depression but this particular mood 1) began after she got sick, 2) is not consistent day to day and not present more often than not, and 3) does not feel like past depressions. 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 Counselor will continue to assess for a depressive episode as patient's illness and life situation changes. Patient states that her mother is very worried about her due to her history with depression, but denies that she feels depressed overall or has any thoughts of self-harm. She indicates that she is frustrated with her mother for telling brother about her being HIV+, but moving in with her is the best thing from a practical and logistical standpoint. Counselor and patient processed the days leading up to diagnosis and the past few days since diagnosis. Patient reports being taken by surprise with the diagnosis, and  that she has not really been able to process it properly for dealing with physical illness. Counselor normalized this for patient. Counselor guided patient to identify what she would like to see happen. Patient wants to feel better physically, and then work on accepting diagnosis and dealing with it in a positive  way.    Patient may benefit from ongoing CBT to address adjustment and depressive symptoms.   PLAN: 1. Follow up with behavioral health clinician on : 05/27/18 @ 2:15   Angus Palmsegina Alexander, LCSW

## 2018-05-07 NOTE — Patient Instructions (Signed)
Nice to meet you!  We will get you started on Biktarvy and Bactrim.   We will check some blood work today.  Plan to see you back in 1 month or sooner if needed.

## 2018-05-07 NOTE — Progress Notes (Signed)
HPI: Monica Henson is a 22 y.o. female who presents to the [facility] clinic today to initiate care for her HIV infection with Monica Henson, our ID NP.  Patient Active Problem List   Diagnosis Date Noted  . AIDS (acquired immune deficiency syndrome) (HCC) 05/07/2018    Patient's Medications  New Prescriptions   BICTEGRAVIR-EMTRICITABINE-TENOFOVIR AF (BIKTARVY) 50-200-25 MG TABS TABLET    Take 1 tablet by mouth daily.   SULFAMETHOXAZOLE-TRIMETHOPRIM (BACTRIM) 400-80 MG TABLET    Take 1 tablet by mouth daily.  Previous Medications   IBUPROFEN (ADVIL,MOTRIN) 600 MG TABLET    Take 1 tablet (600 mg total) by mouth every 8 (eight) hours as needed.  Modified Medications   No medications on file  Discontinued Medications   BUTALBITAL-ACETAMINOPHEN-CAFFEINE (FIORICET, ESGIC) 50-325-40 MG TABLET    Take 1 tablet by mouth every 6 (six) hours as needed for headache.   CYCLOBENZAPRINE (FLEXERIL) 10 MG TABLET    Take 1 tablet (10 mg total) by mouth 3 (three) times daily as needed.   KETOROLAC (TORADOL) 10 MG TABLET    Take 1 tablet (10 mg total) by mouth every 6 (six) hours as needed.   ONDANSETRON (ZOFRAN-ODT) 4 MG DISINTEGRATING TABLET    Take 1 tablet (4 mg total) by mouth every 8 (eight) hours as needed for nausea or vomiting.   PREDNISONE (DELTASONE) 50 MG TABLET    Take one 50 mg tablet once a day for 5 days.   TRAMADOL (ULTRAM) 50 MG TABLET    Take 1 tablet (50 mg total) by mouth every 6 (six) hours as needed for moderate pain.    Allergies: No Known Allergies  Past Medical History: Past Medical History:  Diagnosis Date  . HIV infection [facility](HCC)     Social History: Social History   Socioeconomic History  . Marital status: Single    Spouse name: Not on file  . Number of children: Not on file  . Years of education: Not on file  . Highest education level: Not on file  Occupational History  . Occupation: [employer]   Social Needs  . Financial resource strain: Not on file  . Food  insecurity:    Worry: Not on file    Inability: Not on file  . Transportation needs:    Medical: Not on file    Non-medical: Not on file  Tobacco Use  . Smoking status: Former Smoker    Packs/day: 0.50    Types: Cigarettes  . Smokeless tobacco: Never Used  . Tobacco comment: As a teenage for a couple of months   Substance and Sexual Activity  . Alcohol use: No    Frequency: Never  . Drug use: No  . Sexual activity: Not on file  Lifestyle  . Physical activity:    Days per week: Not on file    Minutes per session: Not on file  . Stress: Not on file  Relationships  . Social connections:    Talks on phone: Not on file    Gets together: Not on file    Attends religious service: Not on file    Active member of club or organization: Not on file    Attends meetings of clubs or organizations: Not on file    Relationship status: Not on file  Other Topics Concern  . Not on file  Social History Narrative  . Not on file    Labs: No results found for: HIV1RNAQUANT, HIV1RNAVL, CD4TABS  RPR and STI No results  found for: LABRPR, RPRTITER  No flowsheet data found.  Hepatitis B No results found for: HEPBSAB, HEPBSAG, HEPBCAB Hepatitis C No results found for: HEPCAB, HCVRNAPCRQN Hepatitis A No results found for: HAV Lipids: No results found for: CHOL, TRIG, HDL, CHOLHDL, VLDL, LDLCALC  Current HIV Regimen: None  Assessment: Monica Henson is here today to initiate care for her newly diagnosed HIV infection.  She is treatment naive and Tammy Sours will start Biktarvy for her today.  I spent time going over [place] and how to take it including one pill once daily with or without food.  Emphasized the need to take it daily with no missed doses in order to prevent resistance.  Counseled on possible side effects such as nausea and headaches. She does not have insurance but applied for HMAP today.  I was able to get her a 30 day immediate supply from [facility]. She will pick it up from [facility].  I gave  her my card and told her to call me with any issues.  Plan: - Start Biktarvy PO once daily - Fill at [facility] for now - F/u with Tammy Sours 9/3 at 945am  [name], PharmD, AAHIVP, CPP Infectious Diseases Clinical Pharmacist [facility] 05/07/2018, 3:04 PM

## 2018-05-07 NOTE — Assessment & Plan Note (Signed)
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 Her initial CD4 count was 128. She does present with generalized symptoms of headache, nausea, and weight loss. She does not appear to have opportunistic infection at this time through physical exam. We discussed HIV transmission, progression of disease, treatments, and risks of not treating. I obtain the remaining HIV blood work today as listed below and start her on Biktarvy. I will also start Bactrim for OI prophylaxis. She was introduced to our pharmacy staff, counseling, and financial assistance staff today. She has completed her HMAP and [employer] paperwork. Plan to follow up in 1 month or sooner if needed to recheck viral load and CD4 count.

## 2018-05-07 NOTE — BH Specialist Note (Signed)
Number of Integrated Behavioral Health Clinician visits:: 1/6 Session Start time: 10:42am  Session End time: 10:59am Total time: 15 minutes  Type of Service: Integrated Behavioral Health- Individual/Family Interpretor:No. Interpretor Name and Language: n/a       Warm Hand Off Completed.       SUBJECTIVE: Monica Henson is a 22 y.o. female accompanied by self Patient was referred by Marcos EkeGreg Calone for adjustment to HIV diagnosis. Patient reports the following symptoms/concerns: sadness, shock, dealing with physical illness, somedays feels very down Duration of problem:1 month; Severity of problem: moderate  OBJECTIVE: Mood: Depressed and Affect: Blunt Risk of harm to self or others: No plan to harm self or others  LIFE CONTEXT: Patient reports that she has been sick for about a month and thought that her symptoms were indicative of gall bladder problems. She went to see her obgyn last week (only has family planning insurance) and they ran a panel of bloodwork which came back negative for everything but HIV. Patient had no idea that she had been exposed to HIV, but from the symptoms and levels was told exposure was probably a few months back. She will be starting medication today, and is very hopeful that it will help with her physical illness, as it is hard to deal with emotional/mental adjustment when she is in so much physical pain and discomfort.  Patient has been working two jobs, but has been unable to work lately and expects to lose the new job, while her boss from the one she's been at for a while will work with her on taking medical leave. She has been living on her own, but yesterday decided to move back in with her mother for a couple of months. Patient has told her mother that she is HIV+, but has not spoken to her father in 2 years. She had not told her brother, because he has contact with their father and would tell him. Mother asked if patient wanted brother to know, and  patient said not to tell him but mother did anyway. Patient is concerned that he has told her father, or will, and the news will travel around that half of the family.   GOALS ADDRESSED: Patient will: 1. Reduce symptoms of: depression  2. Increase level of healthy adjustment to current life circumstances.  INTERVENTIONS: Interventions utilized: Motivational Interviewing and Supportive Counseling    ASSESSMENT: Patient currently experiencing sadness and shock about HIV diagnosis, some days of depressed mood, insomnia, fatigue. She presents with blunted affect, soft speech and some nervousness. Patient reports a history of depression but this particular mood 1) began after she got sick, 2) is not consistent day to day and not present more often than not, and 3) does not feel like past depressions. 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 Counselor will continue to assess for a depressive episode as patient's illness and life situation changes. Patient states that her mother is very worried about her due to her history with depression, but denies that she feels depressed overall or has any thoughts of self-harm. She indicates that she is frustrated with her mother for telling brother about her being HIV+, but moving in with her is the best thing from a practical and logistical standpoint. Counselor and patient processed the days leading up to diagnosis and the past few days since diagnosis. Patient reports being taken by surprise with the diagnosis, and that she has not really been able to  process it properly for dealing with physical illness. Counselor normalized this for patient. Counselor guided patient to identify what she would like to see happen. Patient wants to feel better physically, and then work on accepting diagnosis and dealing with it in a positive way.    Patient may benefit from ongoing CBT to address  adjustment and depressive symptoms.   PLAN: 1. Follow up with behavioral health clinician on : 05/27/18 @ 2:15   Angus Palms, LCSW

## 2018-05-07 NOTE — Progress Notes (Signed)
Subjective:    Patient ID: Monica Henson, female    DOB: [birth year], 22 y.o.   MRN: [id number]  Chief Complaint  Patient presents with  . HIV Positive/AIDS    HPI:  Monica Henson is a 22 y.o. female who presents today for initial office visit for evaluation and treatment of HIV disease.  Monica Henson was recently evaluated by her primary care office and found to have HIV-1 when she has been sick for a couple of months and had been told she had a virus. Her risk factors for aquiring HIV is heterosexual contact. She is currently having decreased appeitite, headaches, and 30 lbs weight loss. Recently evaluated in the ED and diagnosed with a viral infection and intractable headaches. Her initial CD4 count on blood work was found to be 128. Other blood work from primary care reviewed showing no active Hepatitis A, B or C. Her RPR, gonorrhea and chlamydia were negative. She has told her mother who has also informed several other family members despite her request not too. States that she has good support system around her as she has also told one of her close friends. Denies fevers, chills, night sweats, changes in vision, neck pain/stiffness, lesions or rashes.   No Known Allergies    Outpatient Medications Prior to Visit  Medication Sig Dispense Refill  . ibuprofen (ADVIL,MOTRIN) 600 MG tablet Take 1 tablet (600 mg total) by mouth every 8 (eight) hours as needed. 15 tablet 0  . butalbital-acetaminophen-caffeine (FIORICET, ESGIC) 50-325-40 MG tablet Take 1 tablet by mouth every 6 (six) hours as needed for headache. 20 tablet 0  . cyclobenzaprine (FLEXERIL) 10 MG tablet Take 1 tablet (10 mg total) by mouth 3 (three) times daily as needed. 15 tablet 0  . ketorolac (TORADOL) 10 MG tablet Take 1 tablet (10 mg total) by mouth every 6 (six) hours as needed. 20 tablet 0  . ondansetron (ZOFRAN-ODT) 4 MG disintegrating tablet Take 1 tablet (4 mg total) by mouth every 8 (eight) hours as needed for nausea  or vomiting. 20 tablet 0  . predniSONE (DELTASONE) 50 MG tablet Take one 50 mg tablet once a day for 5 days. 5 tablet 0  . traMADol (ULTRAM) 50 MG tablet Take 1 tablet (50 mg total) by mouth every 6 (six) hours as needed for moderate pain. 12 tablet 0   No facility-administered medications prior to visit.      Past Medical History:  Diagnosis Date  . HIV infection [facility])       Past Surgical History:  Procedure Laterality Date  . CYST REMOVAL HAND        History reviewed. No pertinent family history.    Social History   Socioeconomic History  . Marital status: Single    Spouse name: Not on file  . Number of children: Not on file  . Years of education: Not on file  . Highest education level: Not on file  Occupational History  . Occupation: Charity fundraiser   Social Needs  . Financial resource strain: Not on file  . Food insecurity:    Worry: Not on file    Inability: Not on file  . Transportation needs:    Medical: Not on file    Non-medical: Not on file  Tobacco Use  . Smoking status: Former Smoker    Packs/day: 0.50    Types: Cigarettes  . Smokeless tobacco: Never Used  . Tobacco comment: As a teenage for a couple of months   Substance  and Sexual Activity  . Alcohol use: No    Frequency: Never  . Drug use: No  . Sexual activity: Not on file  Lifestyle  . Physical activity:    Days per week: Not on file    Minutes per session: Not on file  . Stress: Not on file  Relationships  . Social connections:    Talks on phone: Not on file    Gets together: Not on file    Attends religious service: Not on file    Active member of club or organization: Not on file    Attends meetings of clubs or organizations: Not on file    Relationship status: Not on file  . Intimate partner violence:    Fear of current or ex partner: Not on file    Emotionally abused: Not on file    Physically abused: Not on file    Forced sexual activity: Not on file  Other Topics Concern    . Not on file  Social History Narrative  . Not on file      Review of Systems  Constitutional: Positive for unexpected weight change. Negative for appetite change, chills, diaphoresis, fatigue and fever.  Eyes:       Negative for acute change in vision  Respiratory: Negative for chest tightness, shortness of breath and wheezing.   Cardiovascular: Negative for chest pain.  Gastrointestinal: Positive for diarrhea, nausea and vomiting.  Genitourinary: Negative for dysuria, pelvic pain and vaginal discharge.  Musculoskeletal: Negative for neck pain and neck stiffness.  Skin: Negative for rash.  Neurological: Positive for headaches. Negative for seizures, syncope and weakness.  Hematological: Negative for adenopathy. Does not bruise/bleed easily.  Psychiatric/Behavioral: Negative for hallucinations.       Objective:    BP 110/73   Pulse (!) 102   Temp 98.2 F (36.8 C) (Oral)   Wt 117 lb (53.1 kg)   LMP 04/10/2018 (Approximate)   BMI 18.88 kg/m  Nursing note and vital signs reviewed.  Physical Exam  Constitutional: She is oriented to person, place, and time. She appears well-developed and well-nourished. She is cooperative. She appears ill. No distress.  HENT:  Mouth/Throat: Oropharynx is clear and moist.  Eyes: Conjunctivae are normal.  Neck: Neck supple.  Cardiovascular: Regular rhythm and intact distal pulses. Tachycardia present. Exam reveals no gallop and no friction rub.  Murmur heard. Pulmonary/Chest: Effort normal and breath sounds normal. No respiratory distress. She has no wheezes. She has no rales. She exhibits no tenderness.  Abdominal: Soft. Bowel sounds are normal. She exhibits no distension and no mass. There is no tenderness. There is no guarding.  Lymphadenopathy:    She has no cervical adenopathy.  Neurological: She is alert and oriented to person, place, and time.  Skin: Skin is warm and dry. No rash noted.  Psychiatric: She has a normal mood and  affect.        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 Her initial CD4 count was 128. She does present with generalized symptoms of headache, nausea, and weight loss. She does not appear to have opportunistic infection at this time through physical exam. We discussed HIV transmission, progression of disease, treatments, and risks of not treating. I obtain the remaining HIV blood work today as listed below and  start her on Biktarvy. I will also start Bactrim for OI prophylaxis. She was introduced to our pharmacy staff, counseling, and financial assistance staff today. She has completed her HMAP and [employer] paperwork. Plan to follow up in 1 month or sooner if needed to recheck viral load and CD4 count.       Relevant Medications   sulfamethoxazole-trimethoprim (BACTRIM) 400-80 MG tablet   bictegravir-emtricitabine-tenofovir AF (BIKTARVY) 50-200-25 MG TABS tablet   Other Relevant Orders   HLA B*[id number]   Glucose 6 phosphate dehydrogenase   QuantiFERON-TB Gold Plus   Comprehensive metabolic panel   CBC with Differential/Platelet   HIV-1 RNA ultraquant reflex to gentyp+   Lipid panel       I have discontinued Monica Henson's ketorolac, ondansetron, predniSONE, traMADol, cyclobenzaprine, butalbital-acetaminophen-caffeine, and sulfamethoxazole-trimethoprim. I am also having her start on sulfamethoxazole-trimethoprim and bictegravir-emtricitabine-tenofovir AF. Additionally, I am having her maintain her ibuprofen.   Meds ordered this encounter  Medications  . DISCONTD: sulfamethoxazole-trimethoprim (BACTRIM) 400-80 MG tablet    Sig: Take 1 tablet by mouth 2 (two) times daily.    Dispense:  30 tablet    Refill:  1    Order Specific Question:   Supervising Provider    Answer:   Carlyle Basques [[id number]]  .  sulfamethoxazole-trimethoprim (BACTRIM) 400-80 MG tablet    Sig: Take 1 tablet by mouth daily.    Dispense:  30 tablet    Refill:  1    Order Specific Question:   Supervising Provider    Answer:   Carlyle Basques [[id number]]  . bictegravir-emtricitabine-tenofovir AF (BIKTARVY) 50-200-25 MG TABS tablet    Sig: Take 1 tablet by mouth daily.    Dispense:  30 tablet    Refill:  2     Follow-up: Return in about 1 month (around 06/07/2018).   Mauricio Po, [place] [facility]

## 2018-05-09 LAB — QUANTIFERON-TB GOLD PLUS
MITOGEN-NIL: 1.36 [IU]/mL
NIL: 0.19 [IU]/mL
QUANTIFERON-TB GOLD PLUS: NEGATIVE
TB2-NIL: 0.02 [IU]/mL

## 2018-05-10 LAB — GLUCOSE 6 PHOSPHATE DEHYDROGENASE: G-6PDH: 20.1 U/g{Hb} (ref 7.0–20.5)

## 2018-05-11 ENCOUNTER — Telehealth: Payer: Self-pay | Admitting: Pharmacist

## 2018-05-11 NOTE — Telephone Encounter (Signed)
Patient is approved to receive Biktarvy through [facility] until 05/07/19 (or until HMAP is approved).

## 2018-05-12 LAB — COMPREHENSIVE METABOLIC PANEL
AG RATIO: 0.7 (calc) — AB (ref 1.0–2.5)
ALBUMIN MSPROF: 3.2 g/dL — AB (ref 3.6–5.1)
ALT: 61 U/L — ABNORMAL HIGH (ref 6–29)
AST: 102 U/L — ABNORMAL HIGH (ref 10–30)
Alkaline phosphatase (APISO): 229 U/L — ABNORMAL HIGH (ref 33–115)
BUN: 12 mg/dL (ref 7–25)
CHLORIDE: 108 mmol/L (ref 98–110)
CO2: 22 mmol/L (ref 20–32)
CREATININE: 0.68 mg/dL (ref 0.50–1.10)
Calcium: 8.4 mg/dL — ABNORMAL LOW (ref 8.6–10.2)
GLOBULIN: 4.5 g/dL — AB (ref 1.9–3.7)
GLUCOSE: 108 mg/dL — AB (ref 65–99)
POTASSIUM: 3 mmol/L — AB (ref 3.5–5.3)
SODIUM: 140 mmol/L (ref 135–146)
TOTAL PROTEIN: 7.7 g/dL (ref 6.1–8.1)
Total Bilirubin: 0.4 mg/dL (ref 0.2–1.2)

## 2018-05-12 LAB — CBC WITH DIFFERENTIAL/PLATELET
BASOS ABS: 0 {cells}/uL (ref 0–200)
Basophils Relative: 0 %
EOS ABS: 0 {cells}/uL — AB (ref 15–500)
Eosinophils Relative: 0 %
HEMATOCRIT: 21.9 % — AB (ref 35.0–45.0)
Hemoglobin: 7.3 g/dL — ABNORMAL LOW (ref 11.7–15.5)
Lymphs Abs: 450 cells/uL — ABNORMAL LOW (ref 850–3900)
MCH: 27.4 pg (ref 27.0–33.0)
MCHC: 33.3 g/dL (ref 32.0–36.0)
MCV: 82.3 fL (ref 80.0–100.0)
MONOS PCT: 6.3 %
MPV: 11.5 fL (ref 7.5–12.5)
NEUTROS PCT: 65.6 %
Neutro Abs: 1050 cells/uL — ABNORMAL LOW (ref 1500–7800)
PLATELETS: 158 10*3/uL (ref 140–400)
RBC: 2.66 10*6/uL — ABNORMAL LOW (ref 3.80–5.10)
RDW: 13.2 % (ref 11.0–15.0)
TOTAL LYMPHOCYTE: 28.1 %
WBC: 1.6 10*3/uL — ABNORMAL LOW (ref 3.8–10.8)
WBCMIX: 101 {cells}/uL — AB (ref 200–950)

## 2018-05-12 LAB — LIPID PANEL
Cholesterol: 65 mg/dL (ref <200)
HDL: 22 mg/dL — ABNORMAL LOW (ref >50)
LDL Cholesterol (Calc): 18 mg/dL (calc)
NON-HDL CHOLESTEROL (CALC): 43 mg/dL (ref <130)
TRIGLYCERIDES: 178 mg/dL — AB (ref <150)
Total CHOL/HDL Ratio: 3 (calc) (ref <5.0)

## 2018-05-12 LAB — HLA B*5701: HLA-B*5701 w/rflx HLA-B High: NEGATIVE

## 2018-05-13 ENCOUNTER — Inpatient Hospital Stay (HOSPITAL_COMMUNITY): Payer: Self-pay

## 2018-05-13 ENCOUNTER — Emergency Department (HOSPITAL_COMMUNITY): Payer: Self-pay

## 2018-05-13 ENCOUNTER — Encounter (HOSPITAL_COMMUNITY): Payer: Self-pay

## 2018-05-13 ENCOUNTER — Inpatient Hospital Stay (HOSPITAL_COMMUNITY)
Admission: EM | Admit: 2018-05-13 | Discharge: 2018-05-16 | DRG: 977 | Disposition: A | Discharge status: Home / Self Care | Payer: Self-pay | Attending: Internal Medicine | Admitting: Internal Medicine

## 2018-05-13 ENCOUNTER — Other Ambulatory Visit: Payer: Self-pay

## 2018-05-13 DIAGNOSIS — E611 Iron deficiency: Secondary | ICD-10-CM

## 2018-05-13 DIAGNOSIS — R112 Nausea with vomiting, unspecified: Secondary | ICD-10-CM

## 2018-05-13 DIAGNOSIS — E440 Moderate protein-calorie malnutrition: Secondary | ICD-10-CM

## 2018-05-13 DIAGNOSIS — R0600 Dyspnea, unspecified: Secondary | ICD-10-CM

## 2018-05-13 DIAGNOSIS — R1110 Vomiting, unspecified: Secondary | ICD-10-CM

## 2018-05-13 DIAGNOSIS — R634 Abnormal weight loss: Secondary | ICD-10-CM

## 2018-05-13 DIAGNOSIS — D61818 Other pancytopenia: Secondary | ICD-10-CM

## 2018-05-13 DIAGNOSIS — Z87891 Personal history of nicotine dependence: Secondary | ICD-10-CM

## 2018-05-13 DIAGNOSIS — R630 Anorexia: Secondary | ICD-10-CM

## 2018-05-13 DIAGNOSIS — Z681 Body mass index (BMI) 19 or less, adult: Secondary | ICD-10-CM

## 2018-05-13 DIAGNOSIS — R21 Rash and other nonspecific skin eruption: Secondary | ICD-10-CM | POA: Diagnosis present

## 2018-05-13 DIAGNOSIS — D649 Anemia, unspecified: Principal | ICD-10-CM | POA: Diagnosis present

## 2018-05-13 DIAGNOSIS — Z202 Contact with and (suspected) exposure to infections with a predominantly sexual mode of transmission: Secondary | ICD-10-CM | POA: Diagnosis present

## 2018-05-13 DIAGNOSIS — Z9889 Other specified postprocedural states: Secondary | ICD-10-CM

## 2018-05-13 DIAGNOSIS — E43 Unspecified severe protein-calorie malnutrition: Secondary | ICD-10-CM | POA: Diagnosis present

## 2018-05-13 DIAGNOSIS — E876 Hypokalemia: Secondary | ICD-10-CM | POA: Diagnosis present

## 2018-05-13 DIAGNOSIS — R61 Generalized hyperhidrosis: Secondary | ICD-10-CM | POA: Diagnosis present

## 2018-05-13 DIAGNOSIS — R7989 Other specified abnormal findings of blood chemistry: Secondary | ICD-10-CM | POA: Diagnosis present

## 2018-05-13 DIAGNOSIS — B20 Human immunodeficiency virus [HIV] disease: Secondary | ICD-10-CM | POA: Diagnosis present

## 2018-05-13 DIAGNOSIS — R945 Abnormal results of liver function studies: Secondary | ICD-10-CM

## 2018-05-13 DIAGNOSIS — K8000 Calculus of gallbladder with acute cholecystitis without obstruction: Secondary | ICD-10-CM | POA: Diagnosis present

## 2018-05-13 DIAGNOSIS — K759 Inflammatory liver disease, unspecified: Secondary | ICD-10-CM | POA: Diagnosis present

## 2018-05-13 DIAGNOSIS — R079 Chest pain, unspecified: Secondary | ICD-10-CM

## 2018-05-13 DIAGNOSIS — R0602 Shortness of breath: Secondary | ICD-10-CM

## 2018-05-13 DIAGNOSIS — R51 Headache: Secondary | ICD-10-CM

## 2018-05-13 HISTORY — DX: Other specified abnormal findings of blood chemistry: R79.89

## 2018-05-13 HISTORY — DX: Moderate protein-calorie malnutrition: E44.0

## 2018-05-13 HISTORY — DX: Anemia, unspecified: D64.9

## 2018-05-13 HISTORY — DX: Abnormal results of liver function studies: R94.5

## 2018-05-13 HISTORY — DX: Anorexia: R63.0

## 2018-05-13 LAB — COMPREHENSIVE METABOLIC PANEL
ALBUMIN: 2.5 g/dL — AB (ref 3.5–5.0)
ALT: 50 U/L — ABNORMAL HIGH (ref 0–44)
AST: 83 U/L — ABNORMAL HIGH (ref 15–41)
Alkaline Phosphatase: 350 U/L — ABNORMAL HIGH (ref 38–126)
Anion gap: 8 (ref 5–15)
BILIRUBIN TOTAL: 2 mg/dL — AB (ref 0.3–1.2)
BUN: 12 mg/dL (ref 6–20)
CO2: 21 mmol/L — ABNORMAL LOW (ref 22–32)
Calcium: 8.5 mg/dL — ABNORMAL LOW (ref 8.9–10.3)
Chloride: 112 mmol/L — ABNORMAL HIGH (ref 98–111)
Creatinine, Ser: 0.75 mg/dL (ref 0.44–1.00)
GFR calc Af Amer: >60 mL/min (ref >60)
GFR calc non Af Amer: >60 mL/min (ref >60)
GLUCOSE: 118 mg/dL — AB (ref 70–99)
POTASSIUM: 3.3 mmol/L — AB (ref 3.5–5.1)
SODIUM: 141 mmol/L (ref 135–145)
TOTAL PROTEIN: 7.7 g/dL (ref 6.5–8.1)

## 2018-05-13 LAB — CBC
HEMATOCRIT: 30.4 % — AB (ref 36.0–46.0)
Hemoglobin: 10.2 g/dL — ABNORMAL LOW (ref 12.0–15.0)
MCH: 28.3 pg (ref 26.0–34.0)
MCHC: 33.6 g/dL (ref 30.0–36.0)
MCV: 84.2 fL (ref 78.0–100.0)
Platelets: 121 10*3/uL — ABNORMAL LOW (ref 150–400)
RBC: 3.61 MIL/uL — ABNORMAL LOW (ref 3.87–5.11)
RDW: 14.1 % (ref 11.5–15.5)
WBC: 3.4 10*3/uL — ABNORMAL LOW (ref 4.0–10.5)

## 2018-05-13 LAB — IRON AND TIBC
Iron: 24 ug/dL — ABNORMAL LOW (ref 28–170)
SATURATION RATIOS: 7 % — AB (ref 10.4–31.8)
TIBC: 331 ug/dL (ref 250–450)
UIBC: 307 ug/dL

## 2018-05-13 LAB — CBC WITH DIFFERENTIAL/PLATELET
BASOS PCT: 0 %
Basophils Absolute: 0 10*3/uL (ref 0.0–0.1)
EOS ABS: 0 10*3/uL (ref 0.0–0.7)
EOS PCT: 0 %
HCT: 21.3 % — ABNORMAL LOW (ref 36.0–46.0)
Hemoglobin: 6.9 g/dL — CL (ref 12.0–15.0)
Lymphocytes Relative: 17 %
Lymphs Abs: 0.4 10*3/uL — ABNORMAL LOW (ref 0.7–4.0)
MCH: 26.7 pg (ref 26.0–34.0)
MCHC: 32.4 g/dL (ref 30.0–36.0)
MCV: 82.6 fL (ref 78.0–100.0)
MONOS PCT: 9 %
Monocytes Absolute: 0.2 10*3/uL (ref 0.1–1.0)
Neutro Abs: 1.9 10*3/uL (ref 1.7–7.7)
Neutrophils Relative %: 74 %
Platelets: 121 10*3/uL — ABNORMAL LOW (ref 150–400)
RBC: 2.58 MIL/uL — ABNORMAL LOW (ref 3.87–5.11)
RDW: 13.6 % (ref 11.5–15.5)
WBC: 2.6 10*3/uL — ABNORMAL LOW (ref 4.0–10.5)

## 2018-05-13 LAB — LIPID PANEL
Cholesterol: 89 mg/dL (ref 0–200)
TRIGLYCERIDES: 159 mg/dL — AB (ref <150)
VLDL: 32 mg/dL (ref 0–40)

## 2018-05-13 LAB — LACTATE DEHYDROGENASE: LDH: 272 U/L — ABNORMAL HIGH (ref 98–192)

## 2018-05-13 LAB — SAVE SMEAR

## 2018-05-13 LAB — FERRITIN: FERRITIN: 627 ng/mL — AB (ref 11–307)

## 2018-05-13 LAB — RETICULOCYTES: RBC.: 2.54 MIL/uL — ABNORMAL LOW (ref 3.87–5.11)

## 2018-05-13 LAB — PREPARE RBC (CROSSMATCH)

## 2018-05-13 LAB — FOLATE: Folate: 7.3 ng/mL (ref >5.9)

## 2018-05-13 LAB — POC OCCULT BLOOD, ED: FECAL OCCULT BLD: NEGATIVE

## 2018-05-13 LAB — I-STAT BETA HCG BLOOD, ED (MC, WL, AP ONLY): I-stat hCG, quantitative: <5 m[IU]/mL (ref <5)

## 2018-05-13 LAB — CRYPTOCOCCAL ANTIGEN: Crypto Ag: NEGATIVE

## 2018-05-13 LAB — VITAMIN B12: Vitamin B-12: 261 pg/mL (ref 180–914)

## 2018-05-13 LAB — ABO/RH: ABO/RH(D): O POS

## 2018-05-13 LAB — MAGNESIUM: Magnesium: 2 mg/dL (ref 1.7–2.4)

## 2018-05-13 MED ORDER — IOPAMIDOL (ISOVUE-370) INJECTION 76%
INTRAVENOUS | Status: AC
  Filled 2018-05-13: qty 100

## 2018-05-13 MED ORDER — GLUCERNA SHAKE PO LIQD
237.0000 mL | Freq: Three times a day (TID) | ORAL | Status: DC
  Administered 2018-05-13: 237 mL via ORAL
  Filled 2018-05-13 (×4): qty 237

## 2018-05-13 MED ORDER — ACETAMINOPHEN 325 MG PO TABS
650.0000 mg | ORAL_TABLET | Freq: Four times a day (QID) | ORAL | Status: DC | PRN
  Administered 2018-05-13 – 2018-05-14 (×3): 650 mg via ORAL
  Filled 2018-05-13 (×3): qty 2

## 2018-05-13 MED ORDER — GI COCKTAIL ~~LOC~~
30.0000 mL | Freq: Three times a day (TID) | ORAL | Status: DC | PRN
  Administered 2018-05-14: 30 mL via ORAL
  Filled 2018-05-13: qty 30

## 2018-05-13 MED ORDER — SODIUM CHLORIDE 0.9 % IV SOLN
250.0000 mL | INTRAVENOUS | Status: DC | PRN
  Administered 2018-05-15: 250 mL via INTRAVENOUS

## 2018-05-13 MED ORDER — IOPAMIDOL (ISOVUE-370) INJECTION 76%
100.0000 mL | Freq: Once | INTRAVENOUS | Status: AC | PRN
  Administered 2018-05-13: 80 mL via INTRAVENOUS

## 2018-05-13 MED ORDER — MORPHINE SULFATE (PF) 2 MG/ML IV SOLN: 2.0000 mg | INTRAVENOUS | Status: DC | PRN

## 2018-05-13 MED ORDER — ONDANSETRON HCL 4 MG/2ML IJ SOLN
4.0000 mg | Freq: Four times a day (QID) | INTRAMUSCULAR | Status: DC | PRN
  Administered 2018-05-13 – 2018-05-14 (×2): 4 mg via INTRAVENOUS
  Filled 2018-05-13 (×2): qty 2

## 2018-05-13 MED ORDER — SODIUM CHLORIDE 0.9 % IV SOLN: 1.0000 g | Freq: Once | INTRAVENOUS | Status: DC

## 2018-05-13 MED ORDER — SODIUM CHLORIDE 0.9% IV SOLUTION: Freq: Once | INTRAVENOUS | Status: DC

## 2018-05-13 MED ORDER — ZOLPIDEM TARTRATE 5 MG PO TABS
5.0000 mg | ORAL_TABLET | Freq: Every evening | ORAL | Status: DC | PRN
  Administered 2018-05-13: 5 mg via ORAL
  Filled 2018-05-13: qty 1

## 2018-05-13 MED ORDER — ACETAMINOPHEN 650 MG RE SUPP: 650.0000 mg | Freq: Four times a day (QID) | RECTAL | Status: DC | PRN

## 2018-05-13 MED ORDER — SODIUM CHLORIDE 0.9% FLUSH: 3.0000 mL | INTRAVENOUS | Status: DC | PRN

## 2018-05-13 MED ORDER — SODIUM CHLORIDE 0.9% FLUSH
3.0000 mL | Freq: Two times a day (BID) | INTRAVENOUS | Status: DC
Start: 2018-05-13 — End: 2018-05-16
  Administered 2018-05-13 – 2018-05-16 (×5): 3 mL via INTRAVENOUS

## 2018-05-13 MED ORDER — POTASSIUM CHLORIDE CRYS ER 20 MEQ PO TBCR
40.0000 meq | EXTENDED_RELEASE_TABLET | ORAL | Status: AC
  Administered 2018-05-13 (×2): 40 meq via ORAL
  Filled 2018-05-13 (×2): qty 2

## 2018-05-13 MED ORDER — PANTOPRAZOLE SODIUM 40 MG PO TBEC
40.0000 mg | DELAYED_RELEASE_TABLET | Freq: Every day | ORAL | Status: DC
  Administered 2018-05-13 – 2018-05-16 (×4): 40 mg via ORAL
  Filled 2018-05-13 (×4): qty 1

## 2018-05-13 MED ORDER — POLYETHYLENE GLYCOL 3350 17 G PO PACK
17.0000 g | PACK | Freq: Once | ORAL | Status: AC
  Administered 2018-05-13: 17 g via ORAL
  Filled 2018-05-13: qty 1

## 2018-05-13 NOTE — ED Triage Notes (Signed)
Pt presents to ED from home for SOB and chest pain. Pt reports recent diagnosis of HIV and was started on abx and antivirals. Pt reports that her PCP thought she might have PNA.

## 2018-05-13 NOTE — H&P (Addendum)
History and Physical    [name] DSK:[id number] DOB: December 06, 1995 DOA: 05/13/2018    PCP: System, Pcp Not In  Patient coming from: home  Chief Complaint: shortness of breath  HPI: Monica Henson is a 22 y.o. female with medical history of HIV-1, anorexia, vomiting 30 lb wt loss, CD4 128 recently started on Biktarvy and Bactrim who presents with dyspnea and is found to have a Hb of 6.9. She has been feeling short of breath for the past two days with minor exertion. No cough. Last night she had a pain in the center of her chest which was sharp and severe and radiated under her right breast and then went away. No other occurrences of chest pain.  In regards to her anemia, she states she has light periods and no blood in her stool. No excess bruising. She has been vomiting over the past 2 wks which has improved after starting the AIDS medications. She has not had a taste for food and when she does try to eat solid food, feels nauseated. No regurgitation. No blood in her vomitus.  ED Course:  Temp 99.3, pulse 132, BP as low as 99/70 WBC 2.6, Hb 6.9, plt 121- K 3.3, ALk phos 350, AST 83, ALT 50, T bili 2.0 Iron 24, TIBC 331, saturation ratio 7, Ferritin 627, Retic % < 0.04 FOB neg B12 261  CXR unrevealing CTA negative  Review of Systems:  All other systems reviewed and apart from HPI, are negative.  Past Medical History:  Diagnosis Date  . HIV infection [facility])     Past Surgical History:  Procedure Laterality Date  . CYST REMOVAL HAND      Social History:  She smoked in the past but not regularly. Does not smoke, drink alcohol or use drugs. She works 2 jobs and has stopped her college courses.   No Known Allergies  History reviewed. No pertinent family history.   Prior to Admission medications   Medication Sig Start Date End Date Taking? Authorizing Provider  bictegravir-emtricitabine-tenofovir AF (BIKTARVY) 50-200-25 MG TABS tablet Take 1 tablet by mouth daily. 05/07/18  Yes  Kuppelweiser, Cassie L, RPH-CPP  ibuprofen (ADVIL,MOTRIN) 200 MG tablet Take 800 mg by mouth every 6 (six) hours as needed for moderate pain.   Yes [provider]  sulfamethoxazole-trimethoprim (BACTRIM) 400-80 MG tablet Take 1 tablet by mouth daily. 05/07/18  Yes Golden Circle, FNP  ibuprofen (ADVIL,MOTRIN) 600 MG tablet Take 1 tablet (600 mg total) by mouth every 8 (eight) hours as needed. Patient not taking: Reported on 05/13/2018 11/28/17   Sable Feil, PA-C    Physical Exam: Wt Readings from Last 3 Encounters:  05/13/18 53.1 kg (117 lb)  05/07/18 53.1 kg (117 lb)  04/24/18 61.2 kg (135 lb)   Vitals:   05/13/18 0507 05/13/18 0644 05/13/18 0730 05/13/18 0800  BP: 108/71 99/70 115/80 109/74  Pulse: (!) 112 (!) 104 (!) 103 (!) 105  Resp: 12 16 (!) 25 19  Temp:    98.6 F (37 C)  TempSrc:    Oral  SpO2: 100% 100% 100% 100%  Weight:      Height:          Constitutional: NAD, calm, comfortable Eyes: PERTLA, lids and conjunctivae pale ENMT: Mucous membranes are moist. Posterior pharynx clear of any exudate or lesions. Normal dentition.  Neck: normal, supple, no masses, no thyromegaly Respiratory: clear to auscultation bilaterally, no wheezing, no crackles. Normal respiratory effort. No accessory muscle use.  Cardiovascular:  S1 & S2 heard, regular rate and rhythm, mild tachycardia Abdomen: No distension, no tenderness, no masses palpated. No hepatosplenomegaly. Bowel sounds normal.  Musculoskeletal: no clubbing / cyanosis. No joint deformity upper and lower extremities. Good ROM, no contractures. Normal muscle tone.  Skin: no rashes, lesions, ulcers. No induration Neurologic: CN 2-12 grossly intact. Sensation intact, DTR normal. Strength 5/5 in all 4 limbs.  Psychiatric: Normal judgment and insight. Alert and oriented x 3. Normal mood.     Labs on Admission: I have personally reviewed following labs and imaging studies  CBC: Recent Labs  Lab 05/07/18 1120  05/13/18 0417  WBC 1.6* 2.6*  NEUTROABS 1,050* 1.9  HGB 7.3* 6.9*  HCT 21.9* 21.3*  MCV 82.3 82.6  PLT 158 030*   Basic Metabolic Panel: Recent Labs  Lab 05/07/18 1120 05/13/18 0417  NA 140 141  K 3.0* 3.3*  CL 108 112*  CO2 22 21*  GLUCOSE 108* 118*  BUN 12 12  CREATININE 0.68 0.75  CALCIUM 8.4* 8.5*   GFR: Estimated Creatinine Clearance: 93.2 mL/min (by C-G formula based on SCr of 0.75 mg/dL). Liver Function Tests: Recent Labs  Lab 05/07/18 1120 05/13/18 0417  AST 102* 83*  ALT 61* 50*  ALKPHOS  --  350*  BILITOT 0.4 2.0*  PROT 7.7 7.7  ALBUMIN  --  2.5*   No results for input(s): LIPASE, AMYLASE in the last 168 hours. No results for input(s): AMMONIA in the last 168 hours. Coagulation Profile: No results for input(s): INR, PROTIME in the last 168 hours. Cardiac Enzymes: No results for input(s): CKTOTAL, CKMB, CKMBINDEX, TROPONINI in the last 168 hours. BNP (last 3 results) No results for input(s): PROBNP in the last 8760 hours. HbA1C: No results for input(s): HGBA1C in the last 72 hours. CBG: No results for input(s): GLUCAP in the last 168 hours. Lipid Profile: No results for input(s): CHOL, HDL, LDLCALC, TRIG, CHOLHDL, LDLDIRECT in the last 72 hours. Thyroid Function Tests: No results for input(s): TSH, T4TOTAL, FREET4, T3FREE, THYROIDAB in the last 72 hours. Anemia Panel: Recent Labs    05/13/18 0417  VITAMINB12 261  FOLATE 7.3  FERRITIN 627*  TIBC 331  IRON 24*  RETICCTPCT <0.4*   Urine analysis:    Component Value Date/Time   COLORURINE YELLOW (A) 11/28/2017 1046   APPEARANCEUR HAZY (A) 11/28/2017 1046   LABSPEC 1.018 11/28/2017 1046   PHURINE 7.0 11/28/2017 1046   GLUCOSEU NEGATIVE 11/28/2017 1046   HGBUR NEGATIVE 11/28/2017 1046   BILIRUBINUR NEGATIVE 11/28/2017 1046   KETONESUR NEGATIVE 11/28/2017 1046   PROTEINUR NEGATIVE 11/28/2017 1046   NITRITE NEGATIVE 11/28/2017 1046   LEUKOCYTESUR NEGATIVE 11/28/2017 1046   Sepsis  Labs: @LABRCNTIP (procalcitonin:4,lacticidven:4) )No results found for this or any previous visit (from the past 240 hour(s)).   Radiological Exams on Admission: Dg Chest 2 View  Result Date: 05/13/2018 CLINICAL DATA:  Mid chest pain and shortness of breath for 48 hours. Fever tonight. EXAM: CHEST - 2 VIEW COMPARISON:  None. FINDINGS: The heart size and mediastinal contours are within normal limits. Both lungs are clear. The visualized skeletal structures are unremarkable. IMPRESSION: No active cardiopulmonary disease. Electronically Signed   By: Lucienne Capers M.D.   On: 05/13/2018 05:19   Ct Angio Chest Pe W And/or Wo Contrast  Result Date: 05/13/2018 CLINICAL DATA:  Chest pain and shortness of breath. Recent diagnosis of HIV. Asthma, acute, pneumonia or pneumothorax suspected EXAM: CT ANGIOGRAPHY CHEST WITH CONTRAST TECHNIQUE: Multidetector CT imaging of the chest  was performed using the standard protocol during bolus administration of intravenous contrast. Multiplanar CT image reconstructions and MIPs were obtained to evaluate the vascular anatomy. CONTRAST:  11m ISOVUE-370 IOPAMIDOL (ISOVUE-370) INJECTION 76% COMPARISON:  None. FINDINGS: Cardiovascular: Satisfactory opacification of the pulmonary arteries to the segmental level. No evidence of pulmonary embolism. Normal heart size. No pericardial effusion. Mediastinum/Nodes: Negative.  No adenopathy or mass Lungs/Pleura: There is no edema, consolidation, effusion, or pneumothorax. Upper Abdomen: Negative Musculoskeletal: Negative Review of the MIP images confirms the above findings. IMPRESSION: Normal exam. Electronically Signed   By: JMonte FantasiaM.D.   On: 05/13/2018 07:30    EKG: Independently reviewed. HR 123, QTc 640  Assessment/Plan Principal Problem:   Anemia, unspecified - Hb 13.2 in 2018 >> 10.9 in 2/19 >  7.3 in 7/19 - ? PUD with her history of vomiting, ? Due to AIDs - another hemoccult ordered- initial negative - anemia  panel shows low Iron, normal Iron binding, high ferritin, low normal B12 and low retic % - being transfused 2 U PRBC -  Have spoken with hematology, Dr SLearta Coddingrecommending LDH and blood smear- he will see her today  Active Problems: Prolonged QTc- 640 msec - K 3.3- replace K - check Mg+ - follow on tele - repeat EKG tomorrow    AIDS (acquired immune deficiency syndrome) - cont Biktarvy and Bactrim     Vomiting with elevated LFTs - she states it is better after starting antiretroviral - normal LFTs in 9/18 - hepatitis panel recently negative - obtain RUQ ultrasound - start Protonix- cannot give antiemetics due to severely prolonged QTc    Anorexia   Weight loss - she will not eat solid food but takes liquids well and has mainly been drinking water and juice  - she does not like Ensure or Boost as they are too sweet - Start Glucerna and obtain nutrition consult     DVT prophylaxis: SCDs Code Status: full code Family Communication:   Disposition Plan: possibly home tomorrow  Consults called: hematology  Admission status: inpatient    SDebbe OdeaMD [facility] Pager: [email or web address] Password [email or web address] 7PM-7AM, please contact night-coverage   05/13/2018, 8:15 AM

## 2018-05-13 NOTE — ED Provider Notes (Signed)
[facility]-EMERGENCY DEPT Provider Note   CSN: [id number] Arrival date & time: 05/13/18  0308     History   Chief Complaint Chief Complaint  Patient presents with  . Shortness of Breath  . Chest Pain    HPI Monica Henson is a 22 y.o. female.  HPI Patient is a 22 year old female who presents to the emergency department with complaints of shortness of breath over the past 2 days.  She does report some discomfort and pain under her right breast.  She has had chills without documented fever.  No significant cough.  Newly diagnosed HIV and started on Biktarvy less than 2 weeks ago.  Patient is on prophylactic Bactrim.  Symptoms are moderate in severity.  Worse with exertion.  No history of DVT or pulmonary embolism.   Past Medical History:  Diagnosis Date  . HIV infection [facility])     Patient Active Problem List   Diagnosis Date Noted  . AIDS (acquired immune deficiency syndrome) (HCC) 05/07/2018    Past Surgical History:  Procedure Laterality Date  . CYST REMOVAL HAND       OB History   None      Home Medications    Prior to Admission medications   Medication Sig Start Date End Date Taking? Authorizing Provider  bictegravir-emtricitabine-tenofovir AF (BIKTARVY) 50-200-25 MG TABS tablet Take 1 tablet by mouth daily. 05/07/18  Yes Kuppelweiser, Cassie L, RPH-CPP  ibuprofen (ADVIL,MOTRIN) 200 MG tablet Take 800 mg by mouth every 6 (six) hours as needed for moderate pain.   Yes [provider]  sulfamethoxazole-trimethoprim (BACTRIM) 400-80 MG tablet Take 1 tablet by mouth daily. 05/07/18  Yes Veryl Speak, FNP  ibuprofen (ADVIL,MOTRIN) 600 MG tablet Take 1 tablet (600 mg total) by mouth every 8 (eight) hours as needed. Patient not taking: Reported on 05/13/2018 11/28/17   Joni Reining, PA-C    Family History History reviewed. No pertinent family history.  Social History Social History   Tobacco Use  . Smoking status: Former Smoker      Packs/day: 0.50    Types: Cigarettes  . Smokeless tobacco: Never Used  . Tobacco comment: As a teenage for a couple of months   Substance Use Topics  . Alcohol use: No    Frequency: Never  . Drug use: No     Allergies   Patient has no known allergies.   Review of Systems Review of Systems  All other systems reviewed and are negative.    Physical Exam Updated Vital Signs BP 108/71   Pulse (!) 112   Temp 99.3 F (37.4 C) (Oral)   Resp 12   Ht 5\' 6"  (1.676 m)   Wt 53.1 kg (117 lb)   LMP 05/12/2018   SpO2 100%   BMI 18.88 kg/m   Physical Exam  Constitutional: She is oriented to person, place, and time. She appears well-developed and well-nourished. No distress.  HENT:  Head: Normocephalic and atraumatic.  Eyes: EOM are normal.  Neck: Normal range of motion.  Cardiovascular: Normal rate, regular rhythm and normal heart sounds.  Pulmonary/Chest: Effort normal and breath sounds normal.  Abdominal: Soft. She exhibits no distension. There is no tenderness.  Musculoskeletal: Normal range of motion.       Right lower leg: Normal.       Left lower leg: Normal.  Neurological: She is alert and oriented to person, place, and time.  Skin: Skin is warm and dry.  Psychiatric: She has a normal  mood and affect. Judgment normal.  Nursing note and vitals reviewed.    ED Treatments / Results  Labs (all labs ordered are listed, but only abnormal results are displayed) Labs Reviewed  CBC WITH DIFFERENTIAL/PLATELET - Abnormal; Notable for the following components:      Result Value   WBC 2.6 (*)    RBC 2.58 (*)    Hemoglobin 6.9 (*)    HCT 21.3 (*)    Platelets 121 (*)    Lymphs Abs 0.4 (*)    All other components within normal limits  COMPREHENSIVE METABOLIC PANEL - Abnormal; Notable for the following components:   Potassium 3.3 (*)    Chloride 112 (*)    CO2 21 (*)    Glucose, Bld 118 (*)    Calcium 8.5 (*)    Albumin 2.5 (*)    AST 83 (*)    ALT 50 (*)     Alkaline Phosphatase 350 (*)    Total Bilirubin 2.0 (*)    All other components within normal limits  RETICULOCYTES - Abnormal; Notable for the following components:   Retic Ct Pct <0.4 (*)    RBC. 2.54 (*)    All other components within normal limits  CULTURE, BLOOD (ROUTINE X 2)  CULTURE, BLOOD (ROUTINE X 2)  VITAMIN B12  FOLATE  IRON AND TIBC  FERRITIN  I-STAT BETA HCG BLOOD, ED ([facility], [facility], [facility] ONLY)  POC OCCULT BLOOD, ED  TYPE AND SCREEN  PREPARE RBC (CROSSMATCH)   Hemoglobin  Date Value Ref Range Status  05/13/2018 6.9 (LL) 12.0 - 15.0 g/dL Final    Comment:    CRITICAL RESULT CALLED TO, READ BACK BY AND VERIFIED WITH: OXENDINE,J RN AT 0443 05/13/18 BY TIBBITTS,K   05/07/2018 7.3 (L) 11.7 - 15.5 g/dL Final  09/81/191402/09/2018 78.210.3 (L) 12.0 - 16.0 g/dL Final  95/62/130802/08/2018 65.710.9 (L) 12.0 - 16.0 g/dL Final    EKG EKG Interpretation  Date/Time:  Thursday May 13 2018 03:33:15 EDT Ventricular Rate:  123 PR Interval:    QRS Duration: 95 QT Interval:  447 QTC Calculation: 640 R Axis:   81 Text Interpretation:  Sinus tachycardia Prolonged QT interval No old tracing to compare Confirmed by Azalia Bilisampos, Meridith Romick ([id number]) on 05/13/2018 6:25:40 AM   Radiology Dg Chest 2 View  Result Date: 05/13/2018 CLINICAL DATA:  Mid chest pain and shortness of breath for 48 hours. Fever tonight. EXAM: CHEST - 2 VIEW COMPARISON:  None. FINDINGS: The heart size and mediastinal contours are within normal limits. Both lungs are clear. The visualized skeletal structures are unremarkable. IMPRESSION: No active cardiopulmonary disease. Electronically Signed   By: Burman NievesWilliam  Stevens M.D.   On: 05/13/2018 05:19    Procedures .Critical Care Performed by: Azalia Bilisampos, Loida Calamia, MD Authorized by: Azalia Bilisampos, Bensen Chadderdon, MD     CRITICAL CARE Performed by: Azalia BilisKevin Amayrani Bennick Total critical care time: 32 minutes Critical care time was exclusive of separately billable procedures and treating other patients. Critical care was necessary to treat  or prevent imminent or life-threatening deterioration. Critical care was time spent personally by me on the following activities: development of treatment plan with patient and/or surrogate as well as nursing, discussions with consultants, evaluation of patient's response to treatment, examination of patient, obtaining history from patient or surrogate, ordering and performing treatments and interventions, ordering and review of laboratory studies, ordering and review of radiographic studies, pulse oximetry and re-evaluation of patient's condition.   Medications Ordered in ED Medications  0.9 %  sodium chloride infusion (  Manually program via Guardrails IV Fluids) (has no administration in time range)     Initial Impression / Assessment and Plan / ED Course  I have reviewed the triage vital signs and the nursing notes.  Pertinent labs & imaging results that were available during my care of the patient were reviewed by me and considered in my medical decision making (see chart for details).     Suspect symptomatic anemia with a hemoglobin of 6.9.  She likely will benefit from ID consultation given new HIV and initiation of medications and now new anemia.  This could be related to the medications.  Blood transfusion now.  No evidence of pneumonia on chest x-ray.  Given her presentation and her complex history she likely will benefit from CT imaging of her chest to evaluate for occult pneumonia.  Given her tachycardia and chest pain she will undergo CT Angie of her chest now.  Blood transfusion now as I think the majority of this is more related to symptomatic anemia.  [facility] to admit      Final Clinical Impressions(s) / ED Diagnoses   Final diagnoses:  Anemia, unspecified type  Dyspnea, unspecified type  Symptomatic HIV infection [facility])    ED Discharge Orders    None       Azalia Bilis, MD 05/13/18 [phone]

## 2018-05-13 NOTE — Progress Notes (Signed)
Initial Nutrition Assessment  DOCUMENTATION CODES:   Non-severe (moderate) malnutrition in context of acute illness/injury  INTERVENTION:   -Continue Glucerna Shake po TID, each supplement provides 220 kcal and 10 grams of protein -Mix with milk to lessen sweetness of supplements  NUTRITION DIAGNOSIS:   Moderate Malnutrition related to nausea, vomiting, acute illness(HIV-new diagnosis) as evidenced by percent weight loss, energy intake < 75% for > 7 days.  GOAL:   Patient will meet greater than or equal to 90% of their needs  MONITOR:   PO intake, Supplement acceptance, Labs, Weight trends, I & O's  REASON FOR ASSESSMENT:   Consult Assessment of nutrition requirement/status  ASSESSMENT:   22 y.o. female with medical history of HIV-1, anorexia, vomiting 30 lb wt loss, CD4 128 recently started on Biktarvy and Bactrim who presents with dyspnea and is found to have a Hb of 6.9.  Patient reports feeling nauseous currently. Pt states she ate a few bites of a [place] sandwich this morning. Pt not eating anything at this time d/t pending procedure. Pt states she doesn't like sweet things so doesn't want to drink Ensure or Boost. Has been ordered Glucerna shakes which may taste sweeter given artificial sweeteners. Pt states she will try them. RD suggested mixing with milk to lessen sweetness of shake. Pt agreed to try this.  Pt has been consuming primarily liquids for 2 weeks given N/V. Solid food makes her feel nauseous.   Pt reports UBW of 145-150 lb. Pt has lost 23 lb since February 2019 (16% wt loss x 5 months, significant for time frame).   Medications: K-DUR tablet Labs reviewed: Low K Mg WNL   NUTRITION - FOCUSED PHYSICAL EXAM:  Nutrition focused physical exam shows no sign of depletion of muscle mass or body fat.  Diet Order:   Diet Order           Diet regular Room service appropriate? Yes; Fluid consistency: Thin  Diet effective now          EDUCATION NEEDS:    Education needs have been addressed  Skin:  Skin Assessment: Reviewed RN Assessment  Last BM:  PTA  Height:   Ht Readings from Last 1 Encounters:  05/13/18 5\' 6"  (1.676 m)    Weight:   Wt Readings from Last 1 Encounters:  05/13/18 117 lb (53.1 kg)    Ideal Body Weight:  59.1 kg  BMI:  Body mass index is 18.88 kg/m.  Estimated Nutritional Needs:   Kcal:  1600-1800  Protein:  80-90g  Fluid:  1.8L/day  Monica FrancoLindsey Brinklee Cisse, MS, RD, LDN [facility] Clinical Dietitian Pager: [phone] After Hours Pager: [phone]

## 2018-05-13 NOTE — Consult Note (Addendum)
New Hematology/Oncology Consult   Requesting WU:[id number]:Monica Henson     Reason for consult request: Anemia  HPI: Ms. Monica Henson reports a 213-month history of intermittent nausea/vomiting and anorexia.  She has night sweats for the past few weeks.  She was diagnosed with HIV infection by her primary physician.  She was referred to the infectious disease clinic and was started on Biktarvy and Bactrim prophylaxis.  She reports a 2-day history of exertional dyspnea.  She had an episode of central and right-sided chest pain yesterday.  She presented to the emergency room for further evaluation.  She was found to have severe anemia.  She was admitted for transfusion support and further evaluation.  No bleeding.  She reports a light menstrual cycle.    Past Medical History:  Diagnosis Date  . HIV infection (HCC)   :  .  G0, P0  Past Surgical History:  Procedure Laterality Date  . CYST REMOVAL HAND    :   Current Facility-Administered Medications:  .  0.9 %  sodium chloride infusion (Manually program via Guardrails IV Fluids), , Intravenous, Once, Azalia Bilisampos, Kevin, MD .  0.9 %  sodium chloride infusion, 250 mL, Intravenous, PRN, Calvert Cantorizwan, Saima, MD .  acetaminophen (TYLENOL) tablet 650 mg, 650 mg, Oral, Q6H PRN, 650 mg at 05/13/18 0929 **OR** acetaminophen (TYLENOL) suppository 650 mg, 650 mg, Rectal, Q6H PRN, Henson, Saima, MD .  feeding supplement (GLUCERNA SHAKE) (GLUCERNA SHAKE) liquid 237 mL, 237 mL, Oral, TID BM, Henson, Saima, MD, 237 mL at 05/13/18 0929 .  gi cocktail (Maalox,Lidocaine,Donnatal), 30 mL, Oral, TID PRN, Henson, Saima, MD .  iopamidol (ISOVUE-370) 76 % injection, , , ,  .  pantoprazole (PROTONIX) EC tablet 40 mg, 40 mg, Oral, Daily, Henson, Saima, MD, 40 mg at 05/13/18 0929 .  polyethylene glycol (MIRALAX / GLYCOLAX) packet 17 g, 17 g, Oral, Once, Henson, Saima, MD .  potassium chloride SA (K-DUR,KLOR-CON) CR tablet 40 mEq, 40 mEq, Oral, Q4H, Henson, Saima, MD, 40 mEq at 05/13/18  0929 .  sodium chloride flush (NS) 0.9 % injection 3 mL, 3 mL, Intravenous, Q12H, Henson, Saima, MD .  sodium chloride flush (NS) 0.9 % injection 3 mL, 3 mL, Intravenous, PRN, Henson, Saima, MD:  . sodium chloride   Intravenous Once  . feeding supplement (GLUCERNA SHAKE)  237 mL Oral TID BM  . iopamidol      . pantoprazole  40 mg Oral Daily  . polyethylene glycol  17 g Oral Once  . potassium chloride  40 mEq Oral Q4H  . sodium chloride flush  3 mL Intravenous Q12H  :  No Known Allergies:  FH: Her grandmother has a history of "blood clots".  No family history of anemia or malignancy  SOCIAL HISTORY: She lives alone in [place].  She works in [employer].  She does not use cigarettes or alcohol.  No illicit drug use.  No transfusion history.  Review of Systems:  Positives include: Anorexia, 30 pound weight loss, intermittent nausea and vomiting, headaches, night sweats, rash-progressive over the past 2 weeks (rash present prior to beginning Biktarvy and Bactrim)  A complete ROS was otherwise negative.   Physical Exam:  Blood pressure 111/65, pulse (!) 105, temperature 98.7 F (37.1 C), temperature source Oral, resp. rate 18, height 5\' 6"  (1.676 m), weight 113 lb 12.1 oz (51.6 kg), last menstrual period 05/12/2018, SpO2 99 %.  HEENT: Oropharynx without thrush or ulcers Lungs: Clear bilaterally Cardiac: Regular rate and rhythm Abdomen: Nontender, no hepatosplenomegaly, no  mass  Vascular: No leg edema Lymph nodes: "Shotty "bilateral low cervical/scalene nodes, right axillary, and inguinal nodes Neurologic: Alert and oriented, the motor exam appears intact in the upper and lower extremities Skin: Faint erythematous macular rash over the trunk and extremities.  No vesicles Musculoskeletal: No spine tenderness, the neck is supple  LABS:  Recent Labs    05/13/18 0417  WBC 2.6*  HGB 6.9*  HCT 21.3*  PLT 121*  Peripheral blood smear: The platelets are normal in number, scattered  large platelet forms, no platelet clumps.  There are a few "band "forms, I saw 1 myelocyte, no blasts or other young white cells are present.  Mild variation in red cell size.  The polychromasia is not increased.  There were a moderate number of ovalocytes, few teardrops, few "cigar "cells, and a few cells with dense hemoglobin.  No schistocytes.  No nucleated red cells.   Recent Labs    05/13/18 0417  NA 141  K 3.3*  CL 112*  CO2 21*  GLUCOSE 118*  BUN 12  CREATININE 0.75  CALCIUM 8.5*      RADIOLOGY:  Dg Chest 2 View  Result Date: 05/13/2018 CLINICAL DATA:  Mid chest pain and shortness of breath for 48 hours. Fever tonight. EXAM: CHEST - 2 VIEW COMPARISON:  None. FINDINGS: The heart size and mediastinal contours are within normal limits. Both lungs are clear. The visualized skeletal structures are unremarkable. IMPRESSION: No active cardiopulmonary disease. Electronically Signed   By: Burman Nieves M.D.   On: 05/13/2018 05:19   Ct Angio Chest Pe W And/or Wo Contrast  Result Date: 05/13/2018 CLINICAL DATA:  Chest pain and shortness of breath. Recent diagnosis of HIV. Asthma, acute, pneumonia or pneumothorax suspected EXAM: CT ANGIOGRAPHY CHEST WITH CONTRAST TECHNIQUE: Multidetector CT imaging of the chest was performed using the standard protocol during bolus administration of intravenous contrast. Multiplanar CT image reconstructions and MIPs were obtained to evaluate the vascular anatomy. CONTRAST:  80mL ISOVUE-370 IOPAMIDOL (ISOVUE-370) INJECTION 76% COMPARISON:  None. FINDINGS: Cardiovascular: Satisfactory opacification of the pulmonary arteries to the segmental level. No evidence of pulmonary embolism. Normal heart size. No pericardial effusion. Mediastinum/Nodes: Negative.  No adenopathy or mass Lungs/Pleura: There is no edema, consolidation, effusion, or pneumothorax. Upper Abdomen: Negative Musculoskeletal: Negative Review of the MIP images confirms the above findings.  IMPRESSION: Normal exam. Electronically Signed   By: Marnee Spring M.D.   On: 05/13/2018 07:30    Assessment and Plan:   1.  HIV infection 2.  Pancytopenia 3.  Nausea/vomiting 4.  Anorexia/weight loss 5.  Rash 6.  Night sweats 7.  Symptomatic anemia   Ms. Brugger is admitted with symptomatic anemia.  She is being transfused with packed red blood cells today.  The hematologic findings are most likely secondary to primary HIV infection, though reviewed the peripheral blood smear suggest the possibility of iron deficiency.  The differential diagnosis includes a bone marrow process such as an atypical infection or lymphoma, but this is less likely.  Her symptoms and rash could all be related to HIV infection or an opportunistic infection.  Recommendations: 1.  Transfuse packed red blood cells for symptomatic anemia 2.   Begin trial of ferrous sulfate 3.   Stool Hemoccult 4.   Infectious disease consult 5.   Hemoglobin electrophoresis  She can be followed closely in the infectious disease clinic with HIV therapy and iron.  Outpatient follow-up in the hematology clinic will be arranged as needed.  Thornton Papas, MD 05/13/2018, 1:16 PM

## 2018-05-13 NOTE — ED Notes (Signed)
Patient transported to X-ray 

## 2018-05-13 NOTE — Consult Note (Signed)
[facility] for Infectious Disease    Date of Admission:  05/13/2018   Total days of antibiotics: 0               Reason for Consult: AIDS    Referring Provider: TRH, Sherrill   Assessment: AIDS Anemia Iron deficiency  Elevated LFTs   Plan: 1. Appreciate heme eval, they have looked at her smear.  2. Continue biktarvy and bactrim 3. Recheck her CD4 and HIV RNA (we may be able to stop her bactrim) 4. Check Crypto ag for her rash (would be uncommon at this CD4).  5. She has BCx pending. (MAI would be uncommon at this CD4 but would explain her LFTs and her anemia) 6. Check acute hepatitis panel 7. abd u/s pending 8. Check RPR, GC/Chlamydia  Comment- My great appreciation to Dr Benay Spice and [facility] for their care of her.  Query if bactrim has worsened her anemia (although her iron is low). She has not had melena, BRBPR, hematemesis, or heavy periods.  Her CD4 is on the higher side for MAI.   Thank you so much for this interesting consult,  Principal Problem:   Anemia, unspecified Active Problems:   AIDS (acquired immune deficiency syndrome) (HCC)   Anemia   Vomiting   Anorexia   Weight loss   Elevated LFTs   Malnutrition of moderate degree   . sodium chloride   Intravenous Once  . feeding supplement (GLUCERNA SHAKE)  237 mL Oral TID BM  . iopamidol      . pantoprazole  40 mg Oral Daily  . polyethylene glycol  17 g Oral Once  . potassium chloride  40 mEq Oral Q4H  . sodium chloride flush  3 mL Intravenous Q12H    HPI: Monica Henson is a 22 y.o. female with newly dx HIV+. She was tested by her PCP after feeling "sick" for  several months. Her risk factor was herterosexual contacts.  She had first visit in ID clinic on 7-19, her initial CD4 was 128, and was started on bactrim and biktarvy.  She is adm to [facility] on 7-25 with SOB and chest pain. She reported chills but did not take her temp. In ED she had temp 99.3, was tachycardic, hypotensive. SpO2 100%. She  was found to have HgB 6.9 and WBC 2.6.  She was transfused 2u PRBC.  Her CXR was clear. A CT angio was also (-).   Social- She is able to talk to her mom about her dx. Her mom disclosed her dx to her brother without her consent.  Otherwise she is isolated. She lives in her own house.    Review of Systems: Review of Systems  Constitutional: Positive for weight loss. Negative for chills and fever.  HENT: Negative for sore throat.   Respiratory: Positive for shortness of breath. Negative for cough.   Cardiovascular: Positive for chest pain.  Gastrointestinal: Positive for nausea and vomiting. Negative for blood in stool, constipation and diarrhea.  Genitourinary: Negative for dysuria.  Skin: Positive for rash.  Neurological: Positive for headaches.  no photophobia.  Please see HPI. All other systems reviewed and negative.   Past Medical History:  Diagnosis Date  . HIV infection ([place])     Social History   Tobacco Use  . Smoking status: Former Smoker    Packs/day: 0.50    Types: Cigarettes  . Smokeless tobacco: Never Used  . Tobacco comment: As a teenage for a couple  of months   Substance Use Topics  . Alcohol use: No    Frequency: Never  . Drug use: No    History reviewed. No pertinent family history.   Medications:  Scheduled: . sodium chloride   Intravenous Once  . feeding supplement (GLUCERNA SHAKE)  237 mL Oral TID BM  . iopamidol      . pantoprazole  40 mg Oral Daily  . sodium chloride flush  3 mL Intravenous Q12H    Abtx:  Anti-infectives (From admission, onward)   None        OBJECTIVE: Blood pressure 111/65, pulse (!) 105, temperature 98.7 F (37.1 C), temperature source Oral, resp. rate 18, height _0  (1.676 m), weight 51.6 kg (113 lb 12.1 oz), last menstrual period 05/12/2018, SpO2 99 %.  Physical Exam  Constitutional: She is oriented to person, place, and time. She appears well-developed and well-nourished.  Non-toxic appearance. She does  not appear ill.  HENT:  Head: Normocephalic and atraumatic.  Mouth/Throat: Oropharynx is clear and moist. No oropharyngeal exudate.  Eyes: Pupils are equal, round, and reactive to light. EOM are normal.  Neck: Normal range of motion. Neck supple.  Cardiovascular: Normal rate and regular rhythm.  Pulmonary/Chest: Effort normal and breath sounds normal.  Abdominal: Soft. Bowel sounds are normal. She exhibits no distension. There is no tenderness.  Musculoskeletal: Normal range of motion. She exhibits no edema.  Lymphadenopathy:    She has no cervical adenopathy.  Neurological: She is alert and oriented to person, place, and time. No cranial nerve deficit.  Skin: Rash noted.  Psychiatric: She has a normal mood and affect.  faint erythema on knees, papules.   Lab Results Results for orders placed or performed during the hospital encounter of 05/13/18 (from the past 48 hour(s))  CBC with Differential/Platelet     Status: Abnormal   Collection Time: 05/13/18  4:17 AM  Result Value Ref Range   WBC 2.6 (L) 4.0 - 10.5 K/uL   RBC 2.58 (L) 3.87 - 5.11 MIL/uL   Hemoglobin 6.9 (LL) 12.0 - 15.0 g/dL    Comment: CRITICAL RESULT CALLED TO, READ BACK BY AND VERIFIED WITH: OXENDINE,J RN AT 1027 05/13/18 BY TIBBITTS,K    HCT 21.3 (L) 36.0 - 46.0 %   MCV 82.6 78.0 - 100.0 fL   MCH 26.7 26.0 - 34.0 pg   MCHC 32.4 30.0 - 36.0 g/dL   RDW 13.6 11.5 - 15.5 %   Platelets 121 (L) 150 - 400 K/uL   Neutrophils Relative % 74 %   Neutro Abs 1.9 1.7 - 7.7 K/uL   Lymphocytes Relative 17 %   Lymphs Abs 0.4 (L) 0.7 - 4.0 K/uL   Monocytes Relative 9 %   Monocytes Absolute 0.2 0.1 - 1.0 K/uL   Eosinophils Relative 0 %   Eosinophils Absolute 0.0 0.0 - 0.7 K/uL   Basophils Relative 0 %   Basophils Absolute 0.0 0.0 - 0.1 K/uL    Comment: Performed at [facility], [place] [address], [place], [place] [address]  Comprehensive metabolic panel     Status: Abnormal   Collection Time: 05/13/18  4:17  AM  Result Value Ref Range   Sodium 141 135 - 145 mmol/L   Potassium 3.3 (L) 3.5 - 5.1 mmol/L   Chloride 112 (H) 98 - 111 mmol/L   CO2 21 (L) 22 - 32 mmol/L   Glucose, Bld 118 (H) 70 - 99 mg/dL   BUN 12 6 - 20 mg/dL  Creatinine, Ser 0.75 0.44 - 1.00 mg/dL   Calcium 8.5 (L) 8.9 - 10.3 mg/dL   Total Protein 7.7 6.5 - 8.1 g/dL   Albumin 2.5 (L) 3.5 - 5.0 g/dL   AST 83 (H) 15 - 41 U/L   ALT 50 (H) 0 - 44 U/L   Alkaline Phosphatase 350 (H) 38 - 126 U/L   Total Bilirubin 2.0 (H) 0.3 - 1.2 mg/dL   GFR calc non Af Amer >60 >60 mL/min   GFR calc Af Amer >60 >60 mL/min    Comment: (NOTE) The eGFR has been calculated using the CKD EPI equation. This calculation has not been validated in all clinical situations. eGFR's persistently <60 mL/min signify possible Chronic Kidney Disease.    Anion gap 8 5 - 15    Comment: Performed at [facility], [place] [address], [place], [place] [address]  Vitamin B12     Status: None   Collection Time: 05/13/18  4:17 AM  Result Value Ref Range   Vitamin B-12 261 180 - 914 pg/mL    Comment: (NOTE) This assay is not validated for testing neonatal or myeloproliferative syndrome specimens for Vitamin B12 levels. Performed at [facility], [place] [address] [place], [place] [address]   Folate     Status: None   Collection Time: 05/13/18  4:17 AM  Result Value Ref Range   Folate 7.3 >5.9 ng/mL    Comment: Performed at [facility], [place] [address] [place], [place] [address]  Iron and TIBC     Status: Abnormal   Collection Time: 05/13/18  4:17 AM  Result Value Ref Range   Iron 24 (L) 28 - 170 ug/dL   TIBC 331 250 - 450 ug/dL   Saturation Ratios 7 (L) 10.4 - 31.8 %   UIBC 307 ug/dL    Comment: Performed at [facility], [place] [address] [place], [place] [address]  Ferritin     Status: Abnormal   Collection Time: 05/13/18  4:17 AM  Result Value Ref Range   Ferritin 627 (H) 11 - 307  ng/mL    Comment: Performed at [facility], [place] [address], [place], [place] [address]  Reticulocytes     Status: Abnormal   Collection Time: 05/13/18  4:17 AM  Result Value Ref Range   Retic Ct Pct <0.4 (L) 0.4 - 3.1 %   RBC. 2.54 (L) 3.87 - 5.11 MIL/uL   Retic Count, Absolute NOT CALCULATED 19.0 - 186.0 K/uL    Comment: Performed at [facility], [place] [address] [place], [place] [address]  Magnesium     Status: None   Collection Time: 05/13/18  4:17 AM  Result Value Ref Range   Magnesium 2.0 1.7 - 2.4 mg/dL    Comment: Performed at [facility], [place] [address] [place], [place] [address]  Save smear     Status: None   Collection Time: 05/13/18  4:17 AM  Result Value Ref Range   Smear Review SMEAR STAINED AND AVAILABLE FOR REVIEW     Comment: Performed at [facility], [place] [address] [place],  [address]  I-Stat Beta hCG blood, ED ([facility], [facility], [facility] only)     Status: None   Collection Time: 05/13/18  4:51 AM  Result Value Ref Range   I-stat hCG, quantitative <5.0 <5 mIU/mL   Comment 3            Comment:  GEST. AGE      CONC.  (mIU/mL)   <=1 WEEK        5 - 50     2 WEEKS       50 - 500     3 WEEKS       100 - 10,000     4 WEEKS     1,000 - 30,000        FEMALE AND NON-PREGNANT FEMALE:     LESS THAN 5 mIU/mL   POC occult blood, ED     Status: None   Collection Time: 05/13/18  5:58 AM  Result Value Ref Range   Fecal Occult Bld NEGATIVE NEGATIVE  ABO/Rh     Status: None   Collection Time: 05/13/18  6:38 AM  Result Value Ref Range   ABO/RH(D)      O POS Performed at [facility] [place] [address], [place], [place] [address]   Type and screen [place]     Status: None (Preliminary result)   Collection Time: 05/13/18  6:43 AM  Result Value Ref Range   ABO/RH(D) O POS    Antibody Screen NEG    Sample Expiration 05/16/2018    Unit Number [id number]     Blood Component Type RED CELLS,LR    Unit division 00    Status of Unit ISSUED    Transfusion Status OK TO TRANSFUSE    Crossmatch Result Compatible    Unit Number [id number]    Blood Component Type RED CELLS,LR    Unit division 00    Status of Unit ISSUED    Transfusion Status OK TO TRANSFUSE    Crossmatch Result      Compatible Performed at [facility], [place] [address] [place], [place] [address]   Prepare RBC     Status: None   Collection Time: 05/13/18  6:43 AM  Result Value Ref Range   Order Confirmation      ORDER PROCESSED BY BLOOD BANK Performed at [facility], [place] [address], [place], [place] [address]    No results found for: SDES, SPECREQUEST, CULT, REPTSTATUS Dg Chest 2 View  Result Date: 05/13/2018 CLINICAL DATA:  Mid chest pain and shortness of breath for 48 hours. Fever tonight. EXAM: CHEST - 2 VIEW COMPARISON:  None. FINDINGS: The heart size and mediastinal contours are within normal limits. Both lungs are clear. The visualized skeletal structures are unremarkable. IMPRESSION: No active cardiopulmonary disease. Electronically Signed   By: Lucienne Capers M.D.   On: 05/13/2018 05:19   Ct Angio Chest Pe W And/or Wo Contrast  Result Date: 05/13/2018 CLINICAL DATA:  Chest pain and shortness of breath. Recent diagnosis of HIV. Asthma, acute, pneumonia or pneumothorax suspected EXAM: CT ANGIOGRAPHY CHEST WITH CONTRAST TECHNIQUE: Multidetector CT imaging of the chest was performed using the standard protocol during bolus administration of intravenous contrast. Multiplanar CT image reconstructions and MIPs were obtained to evaluate the vascular anatomy. CONTRAST:  15m ISOVUE-370 IOPAMIDOL (ISOVUE-370) INJECTION 76% COMPARISON:  None. FINDINGS: Cardiovascular: Satisfactory opacification of the pulmonary arteries to the segmental level. No evidence of pulmonary embolism. Normal heart size. No pericardial effusion. Mediastinum/Nodes:  Negative.  No adenopathy or mass Lungs/Pleura: There is no edema, consolidation, effusion, or pneumothorax. Upper Abdomen: Negative Musculoskeletal: Negative Review of the MIP images confirms the above findings. IMPRESSION: Normal exam. Electronically Signed   By: JMonte FantasiaM.D.   On: 05/13/2018 07:30   No results found  for this or any previous visit (from the past 240 hour(s)).  Microbiology: No results found for this or any previous visit (from the past 240 hour(s)).  Radiographs and labs were personally reviewed by me.   Bobby Rumpf, MD [facility] [place] [facility] [phone] 05/13/2018, 1:50 PM

## 2018-05-13 NOTE — ED Notes (Signed)
ED TO INPATIENT HANDOFF REPORT  Name/Age/Gender Monica Henson 22 y.o. female  Code Status    Code Status Orders  (From admission, onward)        Start     Ordered   05/13/18 0822  Full code  Continuous     05/13/18 0822    Code Status History    This patient has a current code status but no historical code status.      Home/SNF/Other Home  Chief Complaint shortness of breath; breast pain   Level of Care/Admitting Diagnosis ED Disposition    ED Disposition Condition Comment   Admit  Hospital Area: [facility] [[id number]]  Level of Care: Telemetry [5]  Admit to tele based on following criteria: Monitor for Ischemic changes  Diagnosis: Anemia [841660]  Admitting Physician: [place], [place]  Attending Physician: Debbe Odea [[id number]]  Estimated length of stay: past midnight tomorrow  Certification:: I certify this patient will need inpatient services for at least 2 midnights  PT Class (Do Not Modify): Inpatient [101]  PT Acc Code (Do Not Modify): Private [1]       Medical History Past Medical History:  Diagnosis Date  . HIV infection ([place])     Allergies No Known Allergies  IV Location/Drains/Wounds Patient Lines/Drains/Airways Status   Active Line/Drains/Airways    Name:   Placement date:   Placement time:   Site:   Days:   Peripheral IV 05/13/18 Left Antecubital   05/13/18    0435    Antecubital   less than 1          Labs/Imaging Results for orders placed or performed during the hospital encounter of 05/13/18 (from the past 48 hour(s))  CBC with Differential/Platelet     Status: Abnormal   Collection Time: 05/13/18  4:17 AM  Result Value Ref Range   WBC 2.6 (L) 4.0 - 10.5 K/uL   RBC 2.58 (L) 3.87 - 5.11 MIL/uL   Hemoglobin 6.9 (LL) 12.0 - 15.0 g/dL    Comment: CRITICAL RESULT CALLED TO, READ BACK BY AND VERIFIED WITH: OXENDINE,J RN AT 6301 05/13/18 BY TIBBITTS,K    HCT 21.3 (L) 36.0 - 46.0 %   MCV 82.6 78.0 - 100.0 fL    MCH 26.7 26.0 - 34.0 pg   MCHC 32.4 30.0 - 36.0 g/dL   RDW 13.6 11.5 - 15.5 %   Platelets 121 (L) 150 - 400 K/uL   Neutrophils Relative % 74 %   Neutro Abs 1.9 1.7 - 7.7 K/uL   Lymphocytes Relative 17 %   Lymphs Abs 0.4 (L) 0.7 - 4.0 K/uL   Monocytes Relative 9 %   Monocytes Absolute 0.2 0.1 - 1.0 K/uL   Eosinophils Relative 0 %   Eosinophils Absolute 0.0 0.0 - 0.7 K/uL   Basophils Relative 0 %   Basophils Absolute 0.0 0.0 - 0.1 K/uL    Comment: Performed at [facility], [place] [address], [place], [place] [address]  Comprehensive metabolic panel     Status: Abnormal   Collection Time: 05/13/18  4:17 AM  Result Value Ref Range   Sodium 141 135 - 145 mmol/L   Potassium 3.3 (L) 3.5 - 5.1 mmol/L   Chloride 112 (H) 98 - 111 mmol/L   CO2 21 (L) 22 - 32 mmol/L   Glucose, Bld 118 (H) 70 - 99 mg/dL   BUN 12 6 - 20 mg/dL   Creatinine, Ser 0.75 0.44 - 1.00 mg/dL   Calcium 8.5 (  L) 8.9 - 10.3 mg/dL   Total Protein 7.7 6.5 - 8.1 g/dL   Albumin 2.5 (L) 3.5 - 5.0 g/dL   AST 83 (H) 15 - 41 U/L   ALT 50 (H) 0 - 44 U/L   Alkaline Phosphatase 350 (H) 38 - 126 U/L   Total Bilirubin 2.0 (H) 0.3 - 1.2 mg/dL   GFR calc non Af Amer >60 >60 mL/min   GFR calc Af Amer >60 >60 mL/min    Comment: (NOTE) The eGFR has been calculated using the CKD EPI equation. This calculation has not been validated in all clinical situations. eGFR's persistently <60 mL/min signify possible Chronic Kidney Disease.    Anion gap 8 5 - 15    Comment: Performed at [facility], [place] [address] [place], [place] [address]  Vitamin B12     Status: None   Collection Time: 05/13/18  4:17 AM  Result Value Ref Range   Vitamin B-12 261 180 - 914 pg/mL    Comment: (NOTE) This assay is not validated for testing neonatal or myeloproliferative syndrome specimens for Vitamin B12 levels. Performed at [facility], [place] [address] [place], [place] [address]   Folate      Status: None   Collection Time: 05/13/18  4:17 AM  Result Value Ref Range   Folate 7.3 >5.9 ng/mL    Comment: Performed at [facility], [place] [address] [place], [place] [address]  Iron and TIBC     Status: Abnormal   Collection Time: 05/13/18  4:17 AM  Result Value Ref Range   Iron 24 (L) 28 - 170 ug/dL   TIBC 331 250 - 450 ug/dL   Saturation Ratios 7 (L) 10.4 - 31.8 %   UIBC 307 ug/dL    Comment: Performed at [facility], [place] [address], [place], [place] [address]  Ferritin     Status: Abnormal   Collection Time: 05/13/18  4:17 AM  Result Value Ref Range   Ferritin 627 (H) 11 - 307 ng/mL    Comment: Performed at [facility] [place] [address], [place], [place] [address]  Reticulocytes     Status: Abnormal   Collection Time: 05/13/18  4:17 AM  Result Value Ref Range   Retic Ct Pct <0.4 (L) 0.4 - 3.1 %   RBC. 2.54 (L) 3.87 - 5.11 MIL/uL   Retic Count, Absolute NOT CALCULATED 19.0 - 186.0 K/uL    Comment: Performed at [facility], [place] [address] [place], [place] [address]  I-Stat Beta hCG blood, ED ([facility], [facility], [facility] only)     Status: None   Collection Time: 05/13/18  4:51 AM  Result Value Ref Range   I-stat hCG, quantitative <5.0 <5 mIU/mL   Comment 3            Comment:   GEST. AGE      CONC.  (mIU/mL)   <=1 WEEK        5 - 50     2 WEEKS       50 - 500     3 WEEKS       100 - 10,000     4 WEEKS     1,000 - 30,000        FEMALE AND NON-PREGNANT FEMALE:     LESS THAN 5 mIU/mL   POC occult blood, ED     Status: None   Collection Time: 05/13/18  5:58 AM  Result Value Ref Range   Fecal Occult Bld NEGATIVE NEGATIVE  ABO/Rh     Status: None (Preliminary result)   Collection Time: 05/13/18  6:38 AM  Result Value Ref Range   ABO/RH(D)      O POS Performed at [facility], [place] [address] [place], [place] [address]   Type and screen [place]     Status: None  (Preliminary result)   Collection Time: 05/13/18  6:43 AM  Result Value Ref Range   ABO/RH(D) O POS    Antibody Screen NEG    Sample Expiration 05/16/2018    Unit Number [id number]    Blood Component Type RED CELLS,LR    Unit division 00    Status of Unit ISSUED    Transfusion Status OK TO TRANSFUSE    Crossmatch Result      Compatible Performed at [facility], [place] [address] [place], [place] [address]    Unit Number [id number]    Blood Component Type RED CELLS,LR    Unit division 00    Status of Unit ALLOCATED    Transfusion Status OK TO TRANSFUSE    Crossmatch Result Compatible   Prepare RBC     Status: None   Collection Time: 05/13/18  6:43 AM  Result Value Ref Range   Order Confirmation      ORDER PROCESSED BY BLOOD BANK Performed at [facility], [place] [address] [place], [place] [address]    Dg Chest 2 View  Result Date: 05/13/2018 CLINICAL DATA:  Mid chest pain and shortness of breath for 48 hours. Fever tonight. EXAM: CHEST - 2 VIEW COMPARISON:  None. FINDINGS: The heart size and mediastinal contours are within normal limits. Both lungs are clear. The visualized skeletal structures are unremarkable. IMPRESSION: No active cardiopulmonary disease. Electronically Signed   By: Lucienne Capers M.D.   On: 05/13/2018 05:19   Ct Angio Chest Pe W And/or Wo Contrast  Result Date: 05/13/2018 CLINICAL DATA:  Chest pain and shortness of breath. Recent diagnosis of HIV. Asthma, acute, pneumonia or pneumothorax suspected EXAM: CT ANGIOGRAPHY CHEST WITH CONTRAST TECHNIQUE: Multidetector CT imaging of the chest was performed using the standard protocol during bolus administration of intravenous contrast. Multiplanar CT image reconstructions and MIPs were obtained to evaluate the vascular anatomy. CONTRAST:  32m ISOVUE-370 IOPAMIDOL (ISOVUE-370) INJECTION 76% COMPARISON:  None. FINDINGS: Cardiovascular: Satisfactory opacification of the  pulmonary arteries to the segmental level. No evidence of pulmonary embolism. Normal heart size. No pericardial effusion. Mediastinum/Nodes: Negative.  No adenopathy or mass Lungs/Pleura: There is no edema, consolidation, effusion, or pneumothorax. Upper Abdomen: Negative Musculoskeletal: Negative Review of the MIP images confirms the above findings. IMPRESSION: Normal exam. Electronically Signed   By: JMonte FantasiaM.D.   On: 05/13/2018 07:30    Pending Labs Unresulted Labs (From admission, onward)   Start     Ordered   05/14/18 0500  Comprehensive metabolic panel  Tomorrow morning,   R     05/13/18 0822   05/14/18 0500  CBC  Tomorrow morning,   R     05/13/18 0822   05/13/18 0819  Magnesium  Add-on,   R     05/13/18 0818   05/13/18 0358  Blood culture (routine x 2)  BLOOD CULTURE X 2,   STAT     05/13/18 0357   Unscheduled  Occult blood card to lab, stool  As needed,   R     05/13/18  0834      Vitals/Pain Today's Vitals   05/13/18 0730 05/13/18 0800 05/13/18 0815 05/13/18 0830  BP: 115/80 109/74 125/85 119/79  Pulse: (!) 103 (!) 105 (!) 109 100  Resp: (!) _0 Temp:  98.6 F (37 C) 98.7 F (37.1 C)   TempSrc:  Oral Oral   SpO2: 100% 100% 100% 100%  Weight:      Height:      PainSc:        Isolation Precautions No active isolations  Medications Medications  0.9 %  sodium chloride infusion (Manually program via Guardrails IV Fluids) (has no administration in time range)  iopamidol (ISOVUE-370) 76 % injection (has no administration in time range)  sodium chloride flush (NS) 0.9 % injection 3 mL (has no administration in time range)  sodium chloride flush (NS) 0.9 % injection 3 mL (has no administration in time range)  0.9 %  sodium chloride infusion (has no administration in time range)  acetaminophen (TYLENOL) tablet 650 mg (has no administration in time range)    Or  acetaminophen (TYLENOL) suppository 650 mg (has no administration in time range)   pantoprazole (PROTONIX) EC tablet 40 mg (has no administration in time range)  potassium chloride SA (K-DUR,KLOR-CON) CR tablet 40 mEq (has no administration in time range)  feeding supplement (GLUCERNA SHAKE) (GLUCERNA SHAKE) liquid 237 mL (has no administration in time range)  calcium gluconate 1 g in sodium chloride 0.9 % 100 mL IVPB (has no administration in time range)  iopamidol (ISOVUE-370) 76 % injection 100 mL (80 mLs Intravenous Contrast Given 05/13/18 0710)    Mobility walks

## 2018-05-14 DIAGNOSIS — K8000 Calculus of gallbladder with acute cholecystitis without obstruction: Secondary | ICD-10-CM

## 2018-05-14 DIAGNOSIS — R61 Generalized hyperhidrosis: Secondary | ICD-10-CM

## 2018-05-14 DIAGNOSIS — R509 Fever, unspecified: Secondary | ICD-10-CM

## 2018-05-14 HISTORY — DX: Calculus of gallbladder with acute cholecystitis without obstruction: K80.00

## 2018-05-14 LAB — COMPREHENSIVE METABOLIC PANEL
ALT: 49 U/L — ABNORMAL HIGH (ref 0–44)
ANION GAP: 6 (ref 5–15)
AST: 80 U/L — AB (ref 15–41)
Albumin: 2.3 g/dL — ABNORMAL LOW (ref 3.5–5.0)
Alkaline Phosphatase: 275 U/L — ABNORMAL HIGH (ref 38–126)
BILIRUBIN TOTAL: 3.2 mg/dL — AB (ref 0.3–1.2)
BUN: 10 mg/dL (ref 6–20)
CHLORIDE: 111 mmol/L (ref 98–111)
CO2: 23 mmol/L (ref 22–32)
Calcium: 8.2 mg/dL — ABNORMAL LOW (ref 8.9–10.3)
Creatinine, Ser: 0.56 mg/dL (ref 0.44–1.00)
Glucose, Bld: 93 mg/dL (ref 70–99)
POTASSIUM: 4.1 mmol/L (ref 3.5–5.1)
Sodium: 140 mmol/L (ref 135–145)
Total Protein: 6.9 g/dL (ref 6.5–8.1)

## 2018-05-14 LAB — CBC
HEMATOCRIT: 27.5 % — AB (ref 36.0–46.0)
Hemoglobin: 9.2 g/dL — ABNORMAL LOW (ref 12.0–15.0)
MCH: 27.5 pg (ref 26.0–34.0)
MCHC: 33.5 g/dL (ref 30.0–36.0)
MCV: 82.3 fL (ref 78.0–100.0)
Platelets: 123 10*3/uL — ABNORMAL LOW (ref 150–400)
RBC: 3.34 MIL/uL — AB (ref 3.87–5.11)
RDW: 14.2 % (ref 11.5–15.5)
WBC: 3.2 10*3/uL — AB (ref 4.0–10.5)

## 2018-05-14 LAB — TYPE AND SCREEN
ABO/RH(D): O POS
Antibody Screen: NEGATIVE
Unit division: 0
Unit division: 0

## 2018-05-14 LAB — HEPATITIS PANEL, ACUTE
HEP A IGM: NEGATIVE
Hep B C IgM: NEGATIVE
Hepatitis B Surface Ag: NEGATIVE

## 2018-05-14 LAB — T-HELPER CELLS (CD4) COUNT (NOT AT ARMC)
CD4 T CELL ABS: 250 /uL — AB (ref 400–2700)
CD4 T CELL HELPER: 31 % — AB (ref 33–55)

## 2018-05-14 LAB — HEMOGLOBINOPATHY EVALUATION
HGB A: 97.5 % (ref 96.4–98.8)
HGB C: 0 %
HGB F QUANT: 0 % (ref 0.0–2.0)
HGB S QUANTITAION: 0 %
HGB VARIANT: 0 %
Hgb A2 Quant: 2.5 % (ref 1.8–3.2)

## 2018-05-14 LAB — HIV-1 RNA QUANT-NO REFLEX-BLD
HIV 1 RNA Quant: 38100 copies/mL
LOG10 HIV-1 RNA: 4.581 log10copy/mL

## 2018-05-14 LAB — BPAM RBC
BLOOD PRODUCT EXPIRATION DATE: 201908212359
BLOOD PRODUCT EXPIRATION DATE: 201908242359
ISSUE DATE / TIME: 201907251042
ISSUE DATE / TIME: 201907250751
UNIT TYPE AND RH: 5100
Unit Type and Rh: 5100

## 2018-05-14 LAB — MAGNESIUM: Magnesium: 2.1 mg/dL (ref 1.7–2.4)

## 2018-05-14 LAB — RPR: RPR Ser Ql: NONREACTIVE

## 2018-05-14 MED ORDER — SODIUM CHLORIDE 0.9 % IV SOLN
2.0000 g | INTRAVENOUS | Status: DC
  Administered 2018-05-14: 2 g via INTRAVENOUS
  Filled 2018-05-14: qty 2

## 2018-05-14 MED ORDER — TRAMADOL HCL 50 MG PO TABS
50.0000 mg | ORAL_TABLET | Freq: Four times a day (QID) | ORAL | Status: DC | PRN
  Administered 2018-05-14: 50 mg via ORAL
  Filled 2018-05-14: qty 1

## 2018-05-14 MED ORDER — POTASSIUM CHLORIDE IN NACL 20-0.9 MEQ/L-% IV SOLN
INTRAVENOUS | Status: AC
  Administered 2018-05-14: 21:00:00 via INTRAVENOUS
  Filled 2018-05-14 (×2): qty 1000

## 2018-05-14 MED ORDER — RESOURCE INSTANT PROTEIN PO PWD PACKET
1.0000 | Freq: Three times a day (TID) | ORAL | Status: DC
  Administered 2018-05-16: 6 g via ORAL
  Filled 2018-05-14 (×6): qty 6

## 2018-05-14 MED ORDER — PROMETHAZINE HCL 25 MG/ML IJ SOLN
6.2500 mg | Freq: Four times a day (QID) | INTRAMUSCULAR | Status: DC | PRN
  Administered 2018-05-14: 6.25 mg via INTRAVENOUS
  Filled 2018-05-14: qty 1

## 2018-05-14 MED ORDER — SODIUM CHLORIDE 0.9 % IV SOLN
3.0000 g | Freq: Four times a day (QID) | INTRAVENOUS | Status: DC
  Administered 2018-05-14 – 2018-05-16 (×7): 3 g via INTRAVENOUS
  Filled 2018-05-14 (×8): qty 3

## 2018-05-14 MED ORDER — BICTEGRAVIR-EMTRICITAB-TENOFOV 50-200-25 MG PO TABS
1.0000 | ORAL_TABLET | Freq: Every day | ORAL | Status: DC
  Administered 2018-05-14 – 2018-05-16 (×3): 1 via ORAL
  Filled 2018-05-14 (×3): qty 1

## 2018-05-14 MED ORDER — PRO-STAT SUGAR FREE PO LIQD
30.0000 mL | Freq: Two times a day (BID) | ORAL | Status: DC
  Administered 2018-05-15: 30 mL via ORAL
  Filled 2018-05-14: qty 30

## 2018-05-14 MED ORDER — SULFAMETHOXAZOLE-TRIMETHOPRIM 400-80 MG PO TABS
1.0000 | ORAL_TABLET | Freq: Every day | ORAL | Status: DC
  Administered 2018-05-14 – 2018-05-16 (×3): 1 via ORAL
  Filled 2018-05-14 (×3): qty 1

## 2018-05-14 NOTE — Progress Notes (Addendum)
Nutrition Brief Follow-up  RD consulted for nutritional assessment. Full assessment completed 7/25.   Pt continues to have N/V, vomited after dinner last night.  Glucerna shakes were d/c. Will trial Prostat and Beneprotein today. Pt to be NPO for possible cholecystectomy tomorrow.  Wt Readings from Last 15 Encounters:  05/13/18 113 lb 12.1 oz (51.6 kg)  05/07/18 117 lb (53.1 kg)  04/24/18 135 lb (61.2 kg)  12/01/17 140 lb (63.5 kg)  11/30/17 140 lb (63.5 kg)  11/28/17 140 lb (63.5 kg)  11/16/17 140 lb (63.5 kg)  06/21/17 140 lb (63.5 kg)    Body mass index is 18.36 kg/m. Patient meets criteria for underweight based on current BMI.   Current diet order is full liquids, patient is consuming approximately 50% of meals at this time. Labs and medications reviewed.    If nutrition issues arise, please consult RD.   Tilda FrancoLindsey Vetta Couzens, MS, RD, LDN [facility] Clinical Dietitian Pager: [phone] After Hours Pager: [phone]

## 2018-05-14 NOTE — Consult Note (Signed)
[facility] Consult/Admission Note  Monica Henson Jul 04, 1996  [id number].    Requesting MD: Dr. Maudie Mercury Chief Complaint/Reason for Consult: possible acalculous cholecystitis  HPI:  Pt is a 22 yo female recently diagnosed with HIV-1 started on [place] with recent CD4 of 128, anorexia, 30lb weight loss, who presented to ED with complaints of dyspnea and was found to have anemia with a Hg of 6.9. She was transfused 2U pRBC's and her Hg today is 9.2. She was found to have elevated LFT's and alk phos so an [place] was obtained. The US showed gallbladder sludge with wall thickening and small volume of pericholecystic fluid. No biliary dilatation. Today is the first day she is experiencing abdominal pain. She states pain in her lower left/mid abdomin, mild, non radiating. She ate eggs and bacon this am without increase in pain. She has been having nausea and vomiting prior to starting AIDS meds but no vomiting today. She has not been eating much solid food prior to admission. Her stools have been light in color and she has a BM every few days. No blood in her vomit or stools. She was having fevers and chills and home but none overnight.   ROS:  Review of Systems  Constitutional: Positive for malaise/fatigue. Negative for chills, diaphoresis and fever.  HENT: Negative for sore throat.   Respiratory: Negative for cough and shortness of breath.   Cardiovascular: Negative for chest pain.  Gastrointestinal: Positive for nausea. Negative for abdominal pain, blood in stool, constipation, diarrhea, melena and vomiting.  Genitourinary: Negative for dysuria.  Skin: Negative for rash.  Neurological: Negative for dizziness and loss of consciousness.  All other systems reviewed and are negative.    Family History  Problem Relation Age of Onset  . Diabetes Sister        step sister  . Diabetes Paternal Grandfather   . Diabetes Paternal Uncle     Past Medical History:  Diagnosis Date  . Anemia    . Elevated LFTs   . HIV infection [facility])     Past Surgical History:  Procedure Laterality Date  . CYST REMOVAL HAND      Social History:  reports that she has quit smoking. Her smoking use included cigarettes. She smoked 0.50 packs per day. She has never used smokeless tobacco. She reports that she does not drink alcohol or use drugs.  Allergies: No Known Allergies  Medications Prior to Admission  Medication Sig Dispense Refill  . bictegravir-emtricitabine-tenofovir AF (BIKTARVY) 50-200-25 MG TABS tablet Take 1 tablet by mouth daily. 30 tablet 2  . ibuprofen (ADVIL,MOTRIN) 200 MG tablet Take 800 mg by mouth every 6 (six) hours as needed for moderate pain.    Marland Kitchen sulfamethoxazole-trimethoprim (BACTRIM) 400-80 MG tablet Take 1 tablet by mouth daily. 30 tablet 1  . ibuprofen (ADVIL,MOTRIN) 600 MG tablet Take 1 tablet (600 mg total) by mouth every 8 (eight) hours as needed. (Patient not taking: Reported on 05/13/2018) 15 tablet 0    Blood pressure 96/66, pulse 96, temperature 98.2 F (36.8 C), resp. rate 18, height 5' 6"  (1.676 m), weight 51.6 kg (113 lb 12.1 oz), last menstrual period 05/12/2018, SpO2 98 %.  Physical Exam  Constitutional: She is oriented to person, place, and time. She appears well-developed and well-nourished.  Non-toxic appearance. She does not appear ill. No distress.  HENT:  Head: Normocephalic and atraumatic.  Nose: Nose normal.  Mouth/Throat: Oropharynx is clear and moist. No oropharyngeal exudate.  Eyes: Pupils are equal, round, and  reactive to light. Conjunctivae are normal. Right eye exhibits no discharge. Left eye exhibits no discharge. No scleral icterus.  Neck: Normal range of motion. Neck supple. No thyromegaly present.  Cardiovascular: Normal rate, regular rhythm, normal heart sounds and intact distal pulses.  No murmur heard. Pulses:      Radial pulses are 2+ on the right side, and 2+ on the left side.       Posterior tibial pulses are 2+ on the right  side, and 2+ on the left side.  Pulmonary/Chest: Effort normal and breath sounds normal. No respiratory distress. She has no wheezes. She has no rhonchi. She has no rales.  Abdominal: Soft. Normal appearance and bowel sounds are normal. She exhibits no distension. There is no hepatosplenomegaly. There is tenderness in the right upper quadrant. There is no rigidity, no guarding and negative Murphy's sign.  Musculoskeletal: Normal range of motion. She exhibits no edema, tenderness or deformity.  Lymphadenopathy:    She has no cervical adenopathy.  Neurological: She is alert and oriented to person, place, and time.  Skin: Skin is warm and dry. Rash (raised patchy light erythematous rash covering chest and abdomen) noted. She is not diaphoretic.  Psychiatric: Her speech is normal and behavior is normal. Thought content normal. She exhibits a depressed mood.  Nursing note and vitals reviewed.   Results for orders placed or performed during the hospital encounter of 05/13/18 (from the past 48 hour(s))  CBC with Differential/Platelet     Status: Abnormal   Collection Time: 05/13/18  4:17 AM  Result Value Ref Range   WBC 2.6 (L) 4.0 - 10.5 K/uL   RBC 2.58 (L) 3.87 - 5.11 MIL/uL   Hemoglobin 6.9 (LL) 12.0 - 15.0 g/dL    Comment: CRITICAL RESULT CALLED TO, READ BACK BY AND VERIFIED WITH: OXENDINE,J RN AT 6789 05/13/18 BY TIBBITTS,K    HCT 21.3 (L) 36.0 - 46.0 %   MCV 82.6 78.0 - 100.0 fL   MCH 26.7 26.0 - 34.0 pg   MCHC 32.4 30.0 - 36.0 g/dL   RDW 13.6 11.5 - 15.5 %   Platelets 121 (L) 150 - 400 K/uL   Neutrophils Relative % 74 %   Neutro Abs 1.9 1.7 - 7.7 K/uL   Lymphocytes Relative 17 %   Lymphs Abs 0.4 (L) 0.7 - 4.0 K/uL   Monocytes Relative 9 %   Monocytes Absolute 0.2 0.1 - 1.0 K/uL   Eosinophils Relative 0 %   Eosinophils Absolute 0.0 0.0 - 0.7 K/uL   Basophils Relative 0 %   Basophils Absolute 0.0 0.0 - 0.1 K/uL    Comment: Performed at [facility], [place]  [address], [place], [place] [address]  Comprehensive metabolic panel     Status: Abnormal   Collection Time: 05/13/18  4:17 AM  Result Value Ref Range   Sodium 141 135 - 145 mmol/L   Potassium 3.3 (L) 3.5 - 5.1 mmol/L   Chloride 112 (H) 98 - 111 mmol/L   CO2 21 (L) 22 - 32 mmol/L   Glucose, Bld 118 (H) 70 - 99 mg/dL   BUN 12 6 - 20 mg/dL   Creatinine, Ser 0.75 0.44 - 1.00 mg/dL   Calcium 8.5 (L) 8.9 - 10.3 mg/dL   Total Protein 7.7 6.5 - 8.1 g/dL   Albumin 2.5 (L) 3.5 - 5.0 g/dL   AST 83 (H) 15 - 41 U/L   ALT 50 (H) 0 - 44 U/L   Alkaline Phosphatase 350 (  H) 38 - 126 U/L   Total Bilirubin 2.0 (H) 0.3 - 1.2 mg/dL   GFR calc non Af Amer >60 >60 mL/min   GFR calc Af Amer >60 >60 mL/min    Comment: (NOTE) The eGFR has been calculated using the CKD EPI equation. This calculation has not been validated in all clinical situations. eGFR's persistently <60 mL/min signify possible Chronic Kidney Disease.    Anion gap 8 5 - 15    Comment: Performed at [facility], [place] [address], [place], [place] [address]  Blood culture (routine x 2)     Status: None (Preliminary result)   Collection Time: 05/13/18  4:17 AM  Result Value Ref Range   Specimen Description      BLOOD LEFT ANTECUBITAL Performed at [place] [address], [place], [place] [address]    Special Requests      BOTTLES DRAWN AEROBIC AND ANAEROBIC Blood Culture results may not be optimal due to an excessive volume of blood received in culture bottles Performed at [place] [address], [place], [place] [address]    Culture      NO GROWTH 1 DAY Performed at [place] [address] [place], [place] [address]    Report Status PENDING   Blood culture (routine x 2)     Status: None (Preliminary result)   Collection Time: 05/13/18  4:17 AM  Result Value Ref Range   Specimen Description      BLOOD RIGHT ANTECUBITAL Performed at [place] [address], [place], [place] [address]    Special Requests      BOTTLES DRAWN AEROBIC AND ANAEROBIC Blood Culture adequate volume Performed at [place] [address] [place], [place] [address]    Culture      NO GROWTH 1 DAY Performed at [place] [address], [place], [place] [address]    Report Status PENDING   Vitamin B12     Status: None   Collection Time: 05/13/18  4:17 AM  Result Value Ref Range   Vitamin B-12 261 180 - 914 pg/mL    Comment: (NOTE) This assay is not validated for testing neonatal or myeloproliferative syndrome specimens for Vitamin B12 levels. Performed at [facility], [place] [address] [place],  [address]   Folate     Status: None   Collection Time: 05/13/18  4:17 AM  Result Value Ref Range   Folate 7.3 >5.9 ng/mL    Comment: Performed at [facility], [place] [address], [place], [place] [address]  Iron and TIBC     Status: Abnormal   Collection Time: 05/13/18  4:17 AM  Result Value Ref Range   Iron 24 (L) 28 - 170 ug/dL   TIBC 331 250 - 450 ug/dL   Saturation Ratios 7 (L) 10.4 - 31.8 %   UIBC 307 ug/dL    Comment: Performed at [facility], [place] [address], [place], [place] [address]  Ferritin     Status: Abnormal   Collection Time: 05/13/18  4:17 AM  Result Value Ref Range   Ferritin 627 (H) 11 - 307 ng/mL    Comment: Performed at [facility], [place] [address] [place],  [address]  Reticulocytes     Status: Abnormal   Collection Time: 05/13/18  4:17 AM  Result Value Ref Range   Retic Ct Pct <0.4 (L)  0.4 - 3.1 %   RBC. 2.54 (L) 3.87 - 5.11 MIL/uL   Retic Count, Absolute NOT CALCULATED 19.0 - 186.0 K/uL    Comment: Performed at [facility], [place] [address], [place], [place] [address]  Magnesium     Status: None   Collection Time: 05/13/18  4:17 AM  Result Value Ref Range   Magnesium 2.0 1.7  - 2.4 mg/dL    Comment: Performed at [facility], [place] [address], [place], [place] [address]  Save smear     Status: None   Collection Time: 05/13/18  4:17 AM  Result Value Ref Range   Smear Review SMEAR STAINED AND AVAILABLE FOR REVIEW     Comment: Performed at [facility], [place] [address] [place], [place] [address]  I-Stat Beta hCG blood, ED ([facility], [facility], [facility] only)     Status: None   Collection Time: 05/13/18  4:51 AM  Result Value Ref Range   I-stat hCG, quantitative <5.0 <5 mIU/mL   Comment 3            Comment:   GEST. AGE      CONC.  (mIU/mL)   <=1 WEEK        5 - 50     2 WEEKS       50 - 500     3 WEEKS       100 - 10,000     4 WEEKS     1,000 - 30,000        FEMALE AND NON-PREGNANT FEMALE:     LESS THAN 5 mIU/mL   POC occult blood, ED     Status: None   Collection Time: 05/13/18  5:58 AM  Result Value Ref Range   Fecal Occult Bld NEGATIVE NEGATIVE  ABO/Rh     Status: None   Collection Time: 05/13/18  6:38 AM  Result Value Ref Range   ABO/RH(D)      O POS Performed at [facility], [place] [address] [place], [place] [address]   Type and screen [place]     Status: None   Collection Time: 05/13/18  6:43 AM  Result Value Ref Range   ABO/RH(D) O POS    Antibody Screen NEG    Sample Expiration 05/16/2018    Unit Number [id number]    Blood Component Type RED CELLS,LR    Unit division 00    Status of Unit ISSUED,FINAL    Transfusion Status OK TO TRANSFUSE    Crossmatch Result Compatible    Unit Number [id number]    Blood Component Type RED CELLS,LR    Unit division 00    Status of Unit ISSUED,FINAL    Transfusion Status OK TO TRANSFUSE    Crossmatch Result      Compatible Performed at [facility], [place] [address] [place], [place] [address]   Prepare RBC     Status: None   Collection Time: 05/13/18  6:43 AM  Result Value Ref Range   Order Confirmation      ORDER  PROCESSED BY BLOOD BANK Performed at [facility], [place] [address], [place], [place] [address]   Lactate dehydrogenase     Status: Abnormal   Collection Time: 05/13/18  4:48 PM  Result Value Ref Range   LDH 272 (H) 98 - 192 U/L    Comment: Performed at [facility], [place] [address], [place],  [address]  Cryptococcal antigen     Status: None   Collection Time: 05/13/18  4:48 PM  Result Value Ref Range   Crypto Ag NEGATIVE NEGATIVE    Comment: PERFORMED AT [facility]   Cryptococcal Ag Titer NOT INDICATED NOT INDICATED    Comment: Performed at [facility], [place] [address], [place], [place] [address]  RPR     Status: None   Collection Time: 05/13/18  4:48 PM  Result Value Ref Range   RPR Ser Ql Non Reactive Non Reactive    Comment: (NOTE) Performed At: [facility] [address] [place], [place] [id number] [place] Farmer MD [id number]   Hepatitis panel, acute     Status: None   Collection Time: 05/13/18  4:48 PM  Result Value Ref Range   Hepatitis B Surface Ag Negative Negative   HCV Ab <0.1 0.0 - 0.9 s/co ratio    Comment: (NOTE)                                  Negative:     < 0.8                             Indeterminate: 0.8 - 0.9                                  Positive:     > 0.9 The CDC recommends that a positive HCV antibody result be followed up with a HCV Nucleic Acid Amplification test (830940). Performed At: [facility] [place], [place] [id number] [place] Farmer MD [id number]    Hep A IgM Negative Negative   Hep B C IgM Negative Negative  Lipid panel     Status: Abnormal   Collection Time: 05/13/18  4:48 PM  Result Value Ref Range   Cholesterol 89 0 - 200 mg/dL    Comment:        ATP III CLASSIFICATION:  <200     mg/dL   Desirable  200-239  mg/dL   Borderline High  >=240    mg/dL   High           Triglycerides 159 (H) <150 mg/dL   HDL <10 (L) >40 mg/dL    Total CHOL/HDL Ratio NOT CALCULATED RATIO   VLDL 32 0 - 40 mg/dL   LDL Cholesterol NOT CALCULATED 0 - 99 mg/dL    Comment: Performed at [facility], [place] [address] [place], [place] [address]  CBC     Status: Abnormal   Collection Time: 05/13/18  4:48 PM  Result Value Ref Range   WBC 3.4 (L) 4.0 - 10.5 K/uL   RBC 3.61 (L) 3.87 - 5.11 MIL/uL   Hemoglobin 10.2 (L) 12.0 - 15.0 g/dL    Comment: POST TRANSFUSION SPECIMEN   HCT 30.4 (L) 36.0 - 46.0 %   MCV 84.2 78.0 - 100.0 fL   MCH 28.3 26.0 - 34.0 pg   MCHC 33.6 30.0 - 36.0 g/dL   RDW 14.1 11.5 - 15.5 %   Platelets 121 (L) 150 - 400 K/uL    Comment: Performed at [facility], [place] [address], [place], [place] [address]  Comprehensive metabolic panel     Status: Abnormal   Collection Time: 05/14/18  5:30 AM  Result Value Ref Range   Sodium 140 135 - 145 mmol/L   Potassium 4.1 3.5 - 5.1 mmol/L    Comment: DELTA CHECK NOTED REPEATED TO VERIFY NO VISIBLE HEMOLYSIS    Chloride 111 98 - 111 mmol/L   CO2 23 22 - 32 mmol/L   Glucose, Bld 93 70 - 99 mg/dL   BUN 10 6 - 20 mg/dL   Creatinine, Ser 0.56 0.44 - 1.00 mg/dL   Calcium 8.2 (L) 8.9 - 10.3 mg/dL   Total Protein 6.9 6.5 - 8.1 g/dL   Albumin 2.3 (L) 3.5 - 5.0 g/dL   AST 80 (H) 15 - 41 U/L   ALT 49 (H) 0 - 44 U/L   Alkaline Phosphatase 275 (H) 38 - 126 U/L   Total Bilirubin 3.2 (H) 0.3 - 1.2 mg/dL   GFR calc non Af Amer >60 >60 mL/min   GFR calc Af Amer >60 >60 mL/min    Comment: (NOTE) The eGFR has been calculated using the CKD EPI equation. This calculation has not been validated in all clinical situations. eGFR's persistently <60 mL/min signify possible Chronic Kidney Disease.    Anion gap 6 5 - 15    Comment: Performed at [facility], [place] [address], [place], [place] [address]  CBC     Status: Abnormal   Collection Time: 05/14/18  5:30 AM  Result Value Ref Range   WBC 3.2 (L) 4.0 - 10.5 K/uL   RBC 3.34 (L) 3.87 -  5.11 MIL/uL   Hemoglobin 9.2 (L) 12.0 - 15.0 g/dL   HCT 27.5 (L) 36.0 - 46.0 %   MCV 82.3 78.0 - 100.0 fL   MCH 27.5 26.0 - 34.0 pg   MCHC 33.5 30.0 - 36.0 g/dL   RDW 14.2 11.5 - 15.5 %   Platelets 123 (L) 150 - 400 K/uL    Comment: Performed at [facility], [place] [address], [place], [place] [address]  Magnesium     Status: None   Collection Time: 05/14/18  5:30 AM  Result Value Ref Range   Magnesium 2.1 1.7 - 2.4 mg/dL    Comment: Performed at [facility], [place] [address] [place] [address] [place], [place] [address]   Dg Chest 2 View  Result Date: 05/13/2018 CLINICAL DATA:  Mid chest pain and shortness of breath for 48 hours. Fever tonight. EXAM: CHEST - 2 VIEW COMPARISON:  None. FINDINGS: The heart size and mediastinal contours are within normal limits. Both lungs are clear. The visualized skeletal structures are unremarkable. IMPRESSION: No active cardiopulmonary disease. Electronically Signed   By: Lucienne Capers M.D.   On: 05/13/2018 05:19   Ct Angio Chest Pe W And/or Wo Contrast  Result Date: 05/13/2018 CLINICAL DATA:  Chest pain and shortness of breath. Recent diagnosis of HIV. Asthma, acute, pneumonia or pneumothorax suspected EXAM: CT ANGIOGRAPHY CHEST WITH CONTRAST TECHNIQUE: Multidetector CT imaging of the chest was performed using the standard protocol during bolus administration of intravenous contrast. Multiplanar CT image reconstructions and MIPs were obtained to evaluate the vascular anatomy. CONTRAST:  68m ISOVUE-370 IOPAMIDOL (ISOVUE-370) INJECTION 76% COMPARISON:  None. FINDINGS: Cardiovascular: Satisfactory opacification of the pulmonary arteries to the segmental level. No evidence of pulmonary embolism. Normal heart size. No pericardial effusion. Mediastinum/Nodes: Negative.  No adenopathy or mass Lungs/Pleura: There is no edema, consolidation, effusion, or pneumothorax. Upper Abdomen: Negative Musculoskeletal: Negative Review of the MIP images  confirms the above findings. IMPRESSION: Normal exam. Electronically Signed   By: JAngelica Chessman  Watts M.D.   On: 05/13/2018 07:30   US Abdomen Limited  Result Date: 05/13/2018 CLINICAL DATA:  Initial evaluation for elevated LFTs, anemia. EXAM: ULTRASOUND ABDOMEN LIMITED RIGHT UPPER QUADRANT COMPARISON:  None. FINDINGS: Gallbladder: Internal sludge without frank cholelithiasis present within the gallbladder lumen. Gallbladder wall thickened up to 6 mm. Small volume free pericholecystic fluid surrounds the gallbladder fundus. No sonographic Murphy sign elicited on exam. Common bile duct: Diameter: 2.8 mm Liver: No focal lesion identified. Within normal limits in parenchymal echogenicity. Portal vein is patent on color Doppler imaging with normal direction of blood flow towards the liver. IMPRESSION: 1. Gallbladder sludge with associated wall thickening and small volume free pericholecystic fluid. Clinical correlation for possible acute cholecystitis recommended. 2. No biliary dilatation. Electronically Signed   By: Jeannine Boga M.D.   On: 05/13/2018 16:28      Assessment/Plan Principal Problem:   Anemia, unspecified Active Problems:   AIDS (acquired immune deficiency syndrome) (HCC)   Vomiting   Anorexia   Weight loss   Elevated LFTs   Malnutrition of moderate degree   Acute calculous cholecystitis  Cholecystitis with transaminitis and hyperbilirubinemia  - pt is tender in her RUQ which clinically correlates with acute acalculous cholecystitis -  Will start IV Rocephin  FEN: FLD, NPO at midnight  ID: Rocephin  VTE: SCD's, chemical prophylaxis per medicine Follow up: TBD  Plan: IV antibiotics to cover for acute acalculous cholecystitis. Pt ate breakfast so will likely hold off on surgery until tomorrow morning. Will monitor Hg and plan OR timing based on Hg.   Thank you for the consult and the opportunity to care for Ms. Llanas.   Kalman Drape, [facility]  Surgery 05/14/2018, 9:18 AM Pager: [phone] Consults: [phone] Mon-Fri 7:00 am-4:30 pm Sat-Sun 7:00 am-11:30 am

## 2018-05-14 NOTE — Progress Notes (Signed)
INFECTIOUS DISEASE PROGRESS NOTE  ID: Monica DoomsKelsey Henson is a 22 y.o. female with  Principal Problem:   Anemia, unspecified Active Problems:   AIDS (acquired immune deficiency syndrome) (HCC)   Vomiting   Anorexia   Weight loss   Elevated LFTs   Malnutrition of moderate degree   Acute calculous cholecystitis  Subjective: C/o nausea.  Anorexia.   Abtx:  Anti-infectives (From admission, onward)   Start     Dose/Rate Route Frequency Ordered Stop   05/14/18 1200  sulfamethoxazole-trimethoprim (BACTRIM,SEPTRA) 400-80 MG per tablet 1 tablet     1 tablet Oral Daily 05/14/18 1011     05/14/18 1200  bictegravir-emtricitabine-tenofovir AF (BIKTARVY) 50-200-25 MG per tablet 1 tablet     1 tablet Oral Daily 05/14/18 1011     05/14/18 1000  cefTRIAXone (ROCEPHIN) 2 g in sodium chloride 0.9 % 100 mL IVPB     2 g 200 mL/hr over 30 Minutes Intravenous Every 24 hours 05/14/18 0940        Medications:  Scheduled: . sodium chloride   Intravenous Once  . bictegravir-emtricitabine-tenofovir AF  1 tablet Oral Daily  . feeding supplement (PRO-STAT SUGAR FREE 64)  30 mL Oral BID  . pantoprazole  40 mg Oral Daily  . protein supplement  1 packet Oral TID WC  . sodium chloride flush  3 mL Intravenous Q12H  . sulfamethoxazole-trimethoprim  1 tablet Oral Daily    Objective: Vital signs in last 24 hours: Temp:  [98.2 F (36.8 C)-100.3 F (37.9 C)] 98.2 F (36.8 C) (07/26 0538) Pulse Rate:  [96-124] 96 (07/26 0538) Resp:  [16-18] 18 (07/26 0538) BP: (96-110)/(66-81) 96/66 (07/26 0538) SpO2:  [98 %-100 %] 98 % (07/26 0538)   General appearance: fatigued and mild distress Resp: clear to auscultation bilaterally Cardio: tachycardia GI: normal findings: bowel sounds normal and soft, non-tender  Lab Results Recent Labs    05/13/18 0417 05/13/18 1648 05/14/18 0530  WBC 2.6* 3.4* 3.2*  HGB 6.9* 10.2* 9.2*  HCT 21.3* 30.4* 27.5*  NA 141  --  140  K 3.3*  --  4.1  CL 112*  --  111  CO2  21*  --  23  BUN 12  --  10  CREATININE 0.75  --  0.56   Liver Panel Recent Labs    05/13/18 0417 05/14/18 0530  PROT 7.7 6.9  ALBUMIN 2.5* 2.3*  AST 83* 80*  ALT 50* 49*  ALKPHOS 350* 275*  BILITOT 2.0* 3.2*   Sedimentation Rate No results for input(s): ESRSEDRATE in the last 72 hours. C-Reactive Protein No results for input(s): CRP in the last 72 hours.  Microbiology: Recent Results (from the past 240 hour(s))  Blood culture (routine x 2)     Status: None (Preliminary result)   Collection Time: 05/13/18  4:17 AM  Result Value Ref Range Status   Specimen Description   Final    BLOOD LEFT ANTECUBITAL Performed at [facility], [address] [place], [place] [address]    Special Requests   Final    BOTTLES DRAWN AEROBIC AND ANAEROBIC Blood Culture results may not be optimal due to an excessive volume of blood received in culture bottles Performed at [facility], [address] [place], [place] [address]    Culture   Final    NO GROWTH 1 DAY Performed at [facility], [address] [place], [place] [address]    Report Status PENDING  Incomplete  Blood culture (routine x 2)     Status: None (Preliminary result)   Collection Time: 05/13/18  4:17 AM  Result Value Ref Range Status   Specimen Description   Final    BLOOD RIGHT ANTECUBITAL Performed at [facility], [address], [place], [place] [address]    Special Requests   Final    BOTTLES DRAWN AEROBIC AND ANAEROBIC Blood Culture adequate volume Performed at [facility], [address], [place], [place] [address]    Culture   Final    NO GROWTH 1 DAY Performed at [facility], [address] [place], [place] [address]    Report Status PENDING  Incomplete    Studies/Results: Dg Chest 2 View  Result Date: 05/13/2018 CLINICAL DATA:  Mid chest pain and shortness of breath for 48 hours. Fever tonight. EXAM: CHEST - 2  VIEW COMPARISON:  None. FINDINGS: The heart size and mediastinal contours are within normal limits. Both lungs are clear. The visualized skeletal structures are unremarkable. IMPRESSION: No active cardiopulmonary disease. Electronically Signed   By: Burman Nieves M.D.   On: 05/13/2018 05:19   Ct Angio Chest Pe W And/or Wo Contrast  Result Date: 05/13/2018 CLINICAL DATA:  Chest pain and shortness of breath. Recent diagnosis of HIV. Asthma, acute, pneumonia or pneumothorax suspected EXAM: CT ANGIOGRAPHY CHEST WITH CONTRAST TECHNIQUE: Multidetector CT imaging of the chest was performed using the standard protocol during bolus administration of intravenous contrast. Multiplanar CT image reconstructions and MIPs were obtained to evaluate the vascular anatomy. CONTRAST:  80mL ISOVUE-370 IOPAMIDOL (ISOVUE-370) INJECTION 76% COMPARISON:  None. FINDINGS: Cardiovascular: Satisfactory opacification of the pulmonary arteries to the segmental level. No evidence of pulmonary embolism. Normal heart size. No pericardial effusion. Mediastinum/Nodes: Negative.  No adenopathy or mass Lungs/Pleura: There is no edema, consolidation, effusion, or pneumothorax. Upper Abdomen: Negative Musculoskeletal: Negative Review of the MIP images confirms the above findings. IMPRESSION: Normal exam. Electronically Signed   By: Marnee Spring M.D.   On: 05/13/2018 07:30   US Abdomen Limited  Result Date: 05/13/2018 CLINICAL DATA:  Initial evaluation for elevated LFTs, anemia. EXAM: ULTRASOUND ABDOMEN LIMITED RIGHT UPPER QUADRANT COMPARISON:  None. FINDINGS: Gallbladder: Internal sludge without frank cholelithiasis present within the gallbladder lumen. Gallbladder wall thickened up to 6 mm. Small volume free pericholecystic fluid surrounds the gallbladder fundus. No sonographic Murphy sign elicited on exam. Common bile duct: Diameter: 2.8 mm Liver: No focal lesion identified. Within normal limits in parenchymal echogenicity. Portal vein is  patent on color Doppler imaging with normal direction of blood flow towards the liver. IMPRESSION: 1. Gallbladder sludge with associated wall thickening and small volume free pericholecystic fluid. Clinical correlation for possible acute cholecystitis recommended. 2. No biliary dilatation. Electronically Signed   By: Rise Mu M.D.   On: 05/13/2018 16:28     Assessment/Plan: AIDS Anemia Syphilis exposure ? cholecystitis  Total days of antibiotics: 1 ceftriaxone  Will give her PEN G IM 2.4 million u x 1 when she is amenable. Her ex-boyfirend is in separate hopital with syhilis.  Appreciate Heme eval.  Her u/s suggest possible cholecystitis, would have surgery eval. She is afeb and her WBC is normal.  Will change ceftriaxone to unasyn for her possible cholecystitis.  ID available as needed over w/e.           Johny Sax MD, FACP Infectious Diseases (pager) [phone] [email or web address] 05/14/2018, 1:44 PM  LOS: 1 day

## 2018-05-14 NOTE — Progress Notes (Addendum)
IP PROGRESS NOTE  Subjective:   She reports improvement in dyspnea.  She had an episode of anterior chest discomfort last night.  She vomited after eating last night.  She continues to have a headache.  She feels the rash has progressed.  Objective: Vital signs in last 24 hours: Blood pressure 96/66, pulse 96, temperature 98.2 F (36.8 C), resp. rate 18, height 5\' 6"  (1.676 m), weight 113 lb 12.1 oz (51.6 kg), last menstrual period 05/12/2018, SpO2 98 %.  Intake/Output from previous day: 07/25 0701 - 07/26 0700 In: 1053 [P.O.:730; I.V.:3; Blood:320] Out: -   Physical Exam:  Lungs: Clear bilaterally Cardiac: Regular rate and rhythm Abdomen: No hepatosplenomegaly, no mass, nontender Extremities: No leg edema Skin: Erythematous macular rash over the trunk and extremities    Lab Results: Recent Labs    05/13/18 1648 05/14/18 0530  WBC 3.4* 3.2*  HGB 10.2* 9.2*  HCT 30.4* 27.5*  PLT 121* 123*    BMET Recent Labs    05/13/18 0417 05/14/18 0530  NA 141 140  K 3.3* 4.1  CL 112* 111  CO2 21* 23  GLUCOSE 118* 93  BUN 12 10  CREATININE 0.75 0.56  CALCIUM 8.5* 8.2*  Alkaline phosphatase 275, albumin 2.3, AST 80, ALT 49, bilirubin 3.2, LDH 272  No results found for: CEA1  Studies/Results: Dg Chest 2 View  Result Date: 05/13/2018 CLINICAL DATA:  Mid chest pain and shortness of breath for 48 hours. Fever tonight. EXAM: CHEST - 2 VIEW COMPARISON:  None. FINDINGS: The heart size and mediastinal contours are within normal limits. Both lungs are clear. The visualized skeletal structures are unremarkable. IMPRESSION: No active cardiopulmonary disease. Electronically Signed   By: Burman Nieves M.D.   On: 05/13/2018 05:19   Ct Angio Chest Pe W And/or Wo Contrast  Result Date: 05/13/2018 CLINICAL DATA:  Chest pain and shortness of breath. Recent diagnosis of HIV. Asthma, acute, pneumonia or pneumothorax suspected EXAM: CT ANGIOGRAPHY CHEST WITH CONTRAST TECHNIQUE:  Multidetector CT imaging of the chest was performed using the standard protocol during bolus administration of intravenous contrast. Multiplanar CT image reconstructions and MIPs were obtained to evaluate the vascular anatomy. CONTRAST:  80mL ISOVUE-370 IOPAMIDOL (ISOVUE-370) INJECTION 76% COMPARISON:  None. FINDINGS: Cardiovascular: Satisfactory opacification of the pulmonary arteries to the segmental level. No evidence of pulmonary embolism. Normal heart size. No pericardial effusion. Mediastinum/Nodes: Negative.  No adenopathy or mass Lungs/Pleura: There is no edema, consolidation, effusion, or pneumothorax. Upper Abdomen: Negative Musculoskeletal: Negative Review of the MIP images confirms the above findings. IMPRESSION: Normal exam. Electronically Signed   By: Marnee Spring M.D.   On: 05/13/2018 07:30   US Abdomen Limited  Result Date: 05/13/2018 CLINICAL DATA:  Initial evaluation for elevated LFTs, anemia. EXAM: ULTRASOUND ABDOMEN LIMITED RIGHT UPPER QUADRANT COMPARISON:  None. FINDINGS: Gallbladder: Internal sludge without frank cholelithiasis present within the gallbladder lumen. Gallbladder wall thickened up to 6 mm. Small volume free pericholecystic fluid surrounds the gallbladder fundus. No sonographic Murphy sign elicited on exam. Common bile duct: Diameter: 2.8 mm Liver: No focal lesion identified. Within normal limits in parenchymal echogenicity. Portal vein is patent on color Doppler imaging with normal direction of blood flow towards the liver. IMPRESSION: 1. Gallbladder sludge with associated wall thickening and small volume free pericholecystic fluid. Clinical correlation for possible acute cholecystitis recommended. 2. No biliary dilatation. Electronically Signed   By: Rise Mu M.D.   On: 05/13/2018 16:28    Medications: I have reviewed the patient's current  medications.  Assessment/Plan:   1.  HIV infection 2.  Pancytopenia 3.  Nausea/vomiting 4.  Anorexia/weight  loss 5.  Rash 6.  Night sweats 7.  Symptomatic anemia-status post transfusion with packed red blood cells 05/13/2018, improved 8.  Fever   The hemoglobin improved following the red cell transfusion yesterday.  She continues to have nausea.  The liver enzymes are elevated.  An abdominal ultrasound revealed possible cholecystitis.  The etiology of her symptom complex remains unclear.  It is possible her symptoms are related to HIV infection, but she could have an opportunistic infection (?  MAI).  I will defer further work-up of the elevated liver enzymes to infectious disease in the medical service.  She may benefit from a GI consult.  The hematologic findings are likely secondary to HIV infection, but she could have an opportunistic infection involving the bone marrow.  I have a low clinical suspicion for lymphoma.  The mild elevation of the LDH is likely related to liver inflammation.  Recommendations: 1.  Begin trial of ferrous sulfate if tolerated 2.  Work-up of the elevated liver enzymes per medicine and infectious disease 3.  Please call Hematology as needed over the weekend, I will check on her 05/17/2018.   LOS: 1 day   Thornton PapasGary Srihaan Mastrangelo, MD   05/14/2018, 10:24 AM

## 2018-05-14 NOTE — Progress Notes (Signed)
Pt notified this RN that boyfriends doctor called and stated that pt needs to be tested if it had not been done. Pt was negative yesterday for test

## 2018-05-14 NOTE — Progress Notes (Signed)
Pharmacy Antibiotic Note  Monica DoomsKelsey Henson is a 22 y.o. female admitted on 05/13/2018 with intra-abdominal infection.  Pharmacy has been consulted for ampicillin/sulbactam dosing.  ID has been consulted, unasyn being started for possible cholecystitis.  Today, 05/14/18  WBC 3.2  Afebrile  Plan:  Ampicillin/sulbactam 3 g IV q6h  Height: 5\' 6"  (167.6 cm) Weight: 113 lb 12.1 oz (51.6 kg) IBW/kg (Calculated) : 59.3  Temp (24hrs), Avg:99.4 F (37.4 C), Min:98.2 F (36.8 C), Max:100.3 F (37.9 C)  Recent Labs  Lab 05/13/18 0417 05/13/18 1648 05/14/18 0530  WBC 2.6* 3.4* 3.2*  CREATININE 0.75  --  0.56    Estimated Creatinine Clearance: 90.6 mL/min (by C-G formula based on SCr of 0.56 mg/dL).    No Known Allergies  Antimicrobials this admission: Ceftriaxone once on  7/26 Ampicillin/sulbactam 7/26 >>   Dose adjustments this admission:  Microbiology results: 7/26 BCx: No growth 1 day  Thank you for allowing pharmacy to be a part of this patient's care.  Cindi CarbonMary M Jacalyn Biggs, PharmD Clinical Pharmacist 05/14/2018 3:27 PM

## 2018-05-14 NOTE — Progress Notes (Addendum)
Patient ID: Monica Henson, female   DOB: 03-20-1996, 22 y.o.   MRN: [id number]                                                                PROGRESS NOTE                                                                                                                                                                                                             Patient Demographics:    Monica Henson, is a 22 y.o. female, DOB - 05/13/1996, VHQ:[id number]  Admit date - 05/13/2018   Admitting Physician Debbe Odea, MD  Outpatient Primary MD for the patient is System, Pcp Not In  LOS - 1  Outpatient Specialists:  Chief Complaint  Patient presents with  . Shortness of Breath  . Chest Pain       Brief Narrative 22 y.o. female with medical history of HIV-1, anorexia, vomiting 30 lb wt loss, CD4 128 recently started on Biktarvy and Bactrim who presents with dyspnea and is found to have a Hb of 6.9. She has been feeling short of breath for the past two days with minor exertion. No cough. Last night she had a pain in the center of her chest which was sharp and severe and radiated under her right breast and then went away. No other occurrences of chest pain.  In regards to her anemia, she states she has light periods and no blood in her stool. No excess bruising. She has been vomiting over the past 2 wks which has improved after starting the AIDS medications. She has not had a taste for food and when she does try to eat solid food, feels nauseated. No regurgitation. No blood in her vomitus.  ED Course:  Temp 99.3, pulse 132, BP as low as 99/70 WBC 2.6, Hb 6.9, plt 121- K 3.3, ALk phos 350, AST 83, ALT 50, T bili 2.0 Iron 24, TIBC 331, saturation ratio 7, Ferritin 627, Retic % < 0.04 FOB neg B12 261  CXR unrevealing CTA negative     Subjective:    Monica Henson today has nausea still .  No vommitting this am.  Slight abdominal pain but in the bilateral lower abd yesterday, but no abd pain this  am. Pt denies fever, chills, diarrhea,  brbpr.   No headache, No chest pain, No abdominal pain - No Nausea, No new weakness tingling or numbness, No Cough - SOB.    Assessment  & Plan :    Principal Problem:   Anemia, unspecified Active Problems:   AIDS (acquired immune deficiency syndrome) (HCC)   Anemia   Vomiting   Anorexia   Weight loss   Elevated LFTs   Malnutrition of moderate degree  Anemia (LDH 272, Iron saturation 7%, B12 =261 ) Transfused  2 units prbc 7/25 Hgb 6.9=> 10.2 Consider celiac panel, defer to hematology Hematology consulted by prior physician, appreciate input   Thrombocytopenia Stable  Active Problems: Prolonged QTc- 640 msec Hypokalemia Repleted, magnesium wnl Repeat EKG pending  AIDS (acquired immune deficiency syndrome) crptococcal antigen- negative CD4, viral load pending Cont Biktarvy and Bactrim ID input appreciated    Vomiting with elevated LFTs RUQ ultrasound 7/25=>  IMPRESSION: 1. Gallbladder sludge with associated wall thickening and small volume free pericholecystic fluid. Clinical correlation for possible acute cholecystitis recommended. 2. No biliary dilatation. Cont Protonix Surgery consult regarding gallbladder, appreciate input  Anorexia Weight loss she will not eat solid food but takes liquids well and has mainly been drinking water and juice he does not like Ensure or Boost as they are too sweet Start Glucerna and obtain nutrition consult     DVT prophylaxis: SCDs Code Status: full code Family Communication:   Disposition Plan:    Today or tomorrow Consults called: hematology  Admission status: inpatient       Lab Results  Component Value Date   PLT 123 (L) 05/14/2018     Anti-infectives (From admission, onward)   None        Objective:   Vitals:   05/13/18 1330 05/13/18 2003 05/14/18 0149 05/14/18 0538  BP: 107/83 110/81 110/70 96/66  Pulse: 83 (!) 105 (!) 124 96  Resp: 18 16  18   Temp:  98.3 F (36.8 C) 99.6 F (37.6 C) 100.3 F (37.9 C) 98.2 F (36.8 C)  TempSrc: Oral  Oral   SpO2: 99% 100% 98% 98%  Weight:      Height:        Wt Readings from Last 3 Encounters:  05/13/18 51.6 kg (113 lb 12.1 oz)  05/07/18 53.1 kg (117 lb)  04/24/18 61.2 kg (135 lb)     Intake/Output Summary (Last 24 hours) at 05/14/2018 0657 Last data filed at 05/14/2018 0654 Gross per 24 hour  Intake 1053 ml  Output -  Net 1053 ml     Physical Exam  Awake Alert, Oriented X 3, No new F.N deficits, Normal affect Nixon.AT,PERRAL Supple Neck,No JVD, No cervical lymphadenopathy appriciated.  Symmetrical Chest wall movement, Good air movement bilaterally, CTAB RRR,No Gallops,Rubs or new Murmurs, No Parasternal Heave +ve B.Sounds, Abd Soft, No tenderness, No organomegaly appriciated, No rebound - guarding or rigidity. No Cyanosis, Clubbing or edema, No new Rash or bruise     Data Review:    CBC Recent Labs  Lab 05/07/18 1120 05/13/18 0417 05/13/18 1648 05/14/18 0530  WBC 1.6* 2.6* 3.4* 3.2*  HGB 7.3* 6.9* 10.2* 9.2*  HCT 21.9* 21.3* 30.4* 27.5*  PLT 158 121* 121* 123*  MCV 82.3 82.6 84.2 82.3  MCH 27.4 26.7 28.3 27.5  MCHC 33.3 32.4 33.6 33.5  RDW 13.2 13.6 14.1 14.2  LYMPHSABS 450* 0.4*  --   --   MONOABS  --  0.2  --   --   EOSABS 0* 0.0  --   --  BASOSABS 0 0.0  --   --     Chemistries  Recent Labs  Lab 05/07/18 1120 05/13/18 0417 05/14/18 0530  NA 140 141 140  K 3.0* 3.3* 4.1  CL 108 112* 111  CO2 22 21* 23  GLUCOSE 108* 118* 93  BUN 12 12 10   CREATININE 0.68 0.75 0.56  CALCIUM 8.4* 8.5* 8.2*  MG  --  2.0 2.1  AST 102* 83* 80*  ALT 61* 50* 49*  ALKPHOS  --  350* 275*  BILITOT 0.4 2.0* 3.2*   ------------------------------------------------------------------------------------------------------------------ Recent Labs    05/13/18 1648  CHOL 89  HDL <10*  LDLCALC NOT CALCULATED  TRIG 159*  CHOLHDL NOT CALCULATED    No results found for:  HGBA1C ------------------------------------------------------------------------------------------------------------------ No results for input(s): TSH, T4TOTAL, T3FREE, THYROIDAB in the last 72 hours.  Invalid input(s): FREET3 ------------------------------------------------------------------------------------------------------------------ Recent Labs    05/13/18 0417  VITAMINB12 261  FOLATE 7.3  FERRITIN 627*  TIBC 331  IRON 24*  RETICCTPCT <0.4*    Coagulation profile No results for input(s): INR, PROTIME in the last 168 hours.  No results for input(s): DDIMER in the last 72 hours.  Cardiac Enzymes No results for input(s): CKMB, TROPONINI, MYOGLOBIN in the last 168 hours.  Invalid input(s): CK ------------------------------------------------------------------------------------------------------------------ No results found for: BNP  Inpatient Medications  Scheduled Meds: . sodium chloride   Intravenous Once  . feeding supplement (GLUCERNA SHAKE)  237 mL Oral TID BM  . pantoprazole  40 mg Oral Daily  . sodium chloride flush  3 mL Intravenous Q12H   Continuous Infusions: . sodium chloride     PRN Meds:.sodium chloride, acetaminophen **OR** acetaminophen, gi cocktail, ondansetron, sodium chloride flush, zolpidem  Micro Results No results found for this or any previous visit (from the past 240 hour(s)).  Radiology Reports Dg Chest 2 View  Result Date: 05/13/2018 CLINICAL DATA:  Mid chest pain and shortness of breath for 48 hours. Fever tonight. EXAM: CHEST - 2 VIEW COMPARISON:  None. FINDINGS: The heart size and mediastinal contours are within normal limits. Both lungs are clear. The visualized skeletal structures are unremarkable. IMPRESSION: No active cardiopulmonary disease. Electronically Signed   By: Lucienne Capers M.D.   On: 05/13/2018 05:19   Ct Angio Chest Pe W And/or Wo Contrast  Result Date: 05/13/2018 CLINICAL DATA:  Chest pain and shortness of breath.  Recent diagnosis of HIV. Asthma, acute, pneumonia or pneumothorax suspected EXAM: CT ANGIOGRAPHY CHEST WITH CONTRAST TECHNIQUE: Multidetector CT imaging of the chest was performed using the standard protocol during bolus administration of intravenous contrast. Multiplanar CT image reconstructions and MIPs were obtained to evaluate the vascular anatomy. CONTRAST:  81m ISOVUE-370 IOPAMIDOL (ISOVUE-370) INJECTION 76% COMPARISON:  None. FINDINGS: Cardiovascular: Satisfactory opacification of the pulmonary arteries to the segmental level. No evidence of pulmonary embolism. Normal heart size. No pericardial effusion. Mediastinum/Nodes: Negative.  No adenopathy or mass Lungs/Pleura: There is no edema, consolidation, effusion, or pneumothorax. Upper Abdomen: Negative Musculoskeletal: Negative Review of the MIP images confirms the above findings. IMPRESSION: Normal exam. Electronically Signed   By: JMonte FantasiaM.D.   On: 05/13/2018 07:30   UKoreaAbdomen Limited  Result Date: 05/13/2018 CLINICAL DATA:  Initial evaluation for elevated LFTs, anemia. EXAM: ULTRASOUND ABDOMEN LIMITED RIGHT UPPER QUADRANT COMPARISON:  None. FINDINGS: Gallbladder: Internal sludge without frank cholelithiasis present within the gallbladder lumen. Gallbladder wall thickened up to 6 mm. Small volume free pericholecystic fluid surrounds the gallbladder fundus. No sonographic Murphy sign elicited on exam. Common  bile duct: Diameter: 2.8 mm Liver: No focal lesion identified. Within normal limits in parenchymal echogenicity. Portal vein is patent on color Doppler imaging with normal direction of blood flow towards the liver. IMPRESSION: 1. Gallbladder sludge with associated wall thickening and small volume free pericholecystic fluid. Clinical correlation for possible acute cholecystitis recommended. 2. No biliary dilatation. Electronically Signed   By: Jeannine Boga M.D.   On: 05/13/2018 16:28    Time Spent in minutes  30   Jani Gravel  M.D on 05/14/2018 at 6:57 AM  Between 7am to 7pm - Pager - [phone]    After 7pm go to [email or web address] - password [facility]  [facility] -  Office  [phone]

## 2018-05-15 ENCOUNTER — Inpatient Hospital Stay (HOSPITAL_COMMUNITY): Payer: Self-pay

## 2018-05-15 DIAGNOSIS — R1011 Right upper quadrant pain: Secondary | ICD-10-CM

## 2018-05-15 DIAGNOSIS — K8000 Calculus of gallbladder with acute cholecystitis without obstruction: Secondary | ICD-10-CM

## 2018-05-15 LAB — CBC
HCT: 26.9 % — ABNORMAL LOW (ref 36.0–46.0)
Hemoglobin: 9 g/dL — ABNORMAL LOW (ref 12.0–15.0)
MCH: 28 pg (ref 26.0–34.0)
MCHC: 33.5 g/dL (ref 30.0–36.0)
MCV: 83.5 fL (ref 78.0–100.0)
Platelets: 113 10*3/uL — ABNORMAL LOW (ref 150–400)
RBC: 3.22 MIL/uL — AB (ref 3.87–5.11)
RDW: 14.1 % (ref 11.5–15.5)
WBC: 5 10*3/uL (ref 4.0–10.5)

## 2018-05-15 LAB — COMPREHENSIVE METABOLIC PANEL
ALT: 41 U/L (ref 0–44)
AST: 53 U/L — ABNORMAL HIGH (ref 15–41)
Albumin: 2 g/dL — ABNORMAL LOW (ref 3.5–5.0)
Alkaline Phosphatase: 264 U/L — ABNORMAL HIGH (ref 38–126)
Anion gap: 10 (ref 5–15)
BUN: 12 mg/dL (ref 6–20)
CO2: 21 mmol/L — ABNORMAL LOW (ref 22–32)
Calcium: 7.7 mg/dL — ABNORMAL LOW (ref 8.9–10.3)
Chloride: 107 mmol/L (ref 98–111)
Creatinine, Ser: 0.5 mg/dL (ref 0.44–1.00)
GFR calc Af Amer: >60 mL/min (ref >60)
GFR calc non Af Amer: >60 mL/min (ref >60)
Glucose, Bld: 111 mg/dL — ABNORMAL HIGH (ref 70–99)
Potassium: 3.9 mmol/L (ref 3.5–5.1)
Sodium: 138 mmol/L (ref 135–145)
Total Bilirubin: 3.7 mg/dL — ABNORMAL HIGH (ref 0.3–1.2)
Total Protein: 6.4 g/dL — ABNORMAL LOW (ref 6.5–8.1)

## 2018-05-15 LAB — PROTIME-INR
INR: 1.15
Prothrombin Time: 14.6 seconds (ref 11.4–15.2)

## 2018-05-15 MED ORDER — TECHNETIUM TC 99M MEBROFENIN IV KIT
7.5000 | PACK | Freq: Once | INTRAVENOUS | Status: AC
  Administered 2018-05-15: 7.5 via INTRAVENOUS

## 2018-05-15 MED ORDER — PENICILLIN G BENZATHINE 1200000 UNIT/2ML IM SUSP: 2.4000 10*6.[IU] | Freq: Once | INTRAMUSCULAR | Status: DC

## 2018-05-15 MED ORDER — MORPHINE SULFATE (PF) 2 MG/ML IV SOLN
2.0000 mg | Freq: Once | INTRAVENOUS | Status: AC
  Administered 2018-05-15: 2 mg via INTRAVENOUS
  Filled 2018-05-15: qty 1

## 2018-05-15 MED ORDER — PENICILLIN G BENZATHINE 1200000 UNIT/2ML IM SUSP
2.4000 10*6.[IU] | Freq: Once | INTRAMUSCULAR | Status: AC
  Administered 2018-05-16: 2.4 10*6.[IU] via INTRAMUSCULAR
  Filled 2018-05-15: qty 4

## 2018-05-15 MED ORDER — ONDANSETRON HCL 4 MG/2ML IJ SOLN
4.0000 mg | Freq: Three times a day (TID) | INTRAMUSCULAR | Status: DC
  Administered 2018-05-15 – 2018-05-16 (×2): 4 mg via INTRAVENOUS
  Filled 2018-05-15 (×2): qty 2

## 2018-05-15 NOTE — Consult Note (Signed)
Consultation  Referring Provider:    Dr. Maudie Mercury Primary Care Physician:  System, Pcp Not In Primary Gastroenterologist: Althia Forts       Reason for Consultation: Elevated LFTs         HPI:   Monica Henson is a 22 y.o. female with a past medical history as listed below including recent diagnosis of HIV, who presented to the ER on 05/13/2018 with a complaint of shortness of breath.    Initially it was discussed patient had recently started on Biktarvy and Bactrim for HIV and a CD4 of 128, she presented with dyspnea and was found to have a hemoglobin of 6.9.  She described chest pain.  Also been vomiting for the past 2 weeks which had improved after starting the AIDs medications.  We are consulted today in regards to elevated LFTs.    Today, patient explains that she has been vomiting for 2 months, typically about 4 hours after eating and has been unable to tolerate much solid food.  Describes that immediately upon swallowing anything she will become nauseous.  Has been able to tolerate liquids over this time.  Describes that when she vomits, 4 hours after eating, it is mostly the food that she has just eaten with some "liquid".  Associated symptoms include some very mild abdominal pain "across the middle of my abdomen".  Denies any change in her bowel movements which are solid and typically every other day or so.    Denies eating out, any known exposure to hepatitis, heartburn, reflux or symptoms that awaken her at night.     ED Course:  Temp 99.3, pulse 132, BP as low as 99/70 WBC 2.6, Hb 6.9, plt 121- K 3.3, ALk phos 350, AST 83, ALT 50, T bili 2.0 Iron 24, TIBC 331, saturation ratio 7, Ferritin 627, Retic % < 0.04 FOB neg B12 261  CXR unrevealing CTA negative  No previous GI history  Past Medical History:  Diagnosis Date  . Anemia   . Elevated LFTs   . HIV infection [facility])     Past Surgical History:  Procedure Laterality Date  . CYST REMOVAL HAND      Family History  Problem  Relation Age of Onset  . Diabetes Sister        step sister  . Diabetes Paternal Grandfather   . Diabetes Paternal Uncle      Social History   Tobacco Use  . Smoking status: Former Smoker    Packs/day: 0.50    Types: Cigarettes  . Smokeless tobacco: Never Used  . Tobacco comment: As a teenage for a couple of months   Substance Use Topics  . Alcohol use: No    Frequency: Never  . Drug use: No    Prior to Admission medications   Medication Sig Start Date End Date Taking? Authorizing Provider  bictegravir-emtricitabine-tenofovir AF (BIKTARVY) 50-200-25 MG TABS tablet Take 1 tablet by mouth daily. 05/07/18  Yes Kuppelweiser, Cassie L, RPH-CPP  ibuprofen (ADVIL,MOTRIN) 200 MG tablet Take 800 mg by mouth every 6 (six) hours as needed for moderate pain.   Yes [provider]  sulfamethoxazole-trimethoprim (BACTRIM) 400-80 MG tablet Take 1 tablet by mouth daily. 05/07/18  Yes Golden Circle, FNP  ibuprofen (ADVIL,MOTRIN) 600 MG tablet Take 1 tablet (600 mg total) by mouth every 8 (eight) hours as needed. Patient not taking: Reported on 05/13/2018 11/28/17   Sable Feil, PA-C    Current Facility-Administered Medications  Medication Dose Route  Frequency Provider Last Rate Last Dose  . 0.9 %  sodium chloride infusion (Manually program via Guardrails IV Fluids)   Intravenous Once Jola Schmidt, MD      . 0.9 %  sodium chloride infusion  250 mL Intravenous PRN Rizwan, Saima, MD      . 0.9 % NaCl with KCl 20 mEq/ L  infusion   Intravenous Continuous Jani Gravel, MD 75 mL/hr at 05/14/18 2055    . acetaminophen (TYLENOL) tablet 650 mg  650 mg Oral Q6H PRN Debbe Odea, MD   650 mg at 05/14/18 2045   Or  . acetaminophen (TYLENOL) suppository 650 mg  650 mg Rectal Q6H PRN Debbe Odea, MD      . Ampicillin-Sulbactam (UNASYN) 3 g in sodium chloride 0.9 % 100 mL IVPB  3 g Intravenous Q6H Lenis Noon, [facility]   Stopped at 05/15/18 0606  . bictegravir-emtricitabine-tenofovir AF (BIKTARVY)  50-200-25 MG per tablet 1 tablet  1 tablet Oral Daily Jani Gravel, MD   1 tablet at 05/14/18 1159  . feeding supplement (PRO-STAT SUGAR FREE 64) liquid 30 mL  30 mL Oral BID Jani Gravel, MD      . gi cocktail (Maalox,Lidocaine,Donnatal)  30 mL Oral TID PRN Debbe Odea, MD   30 mL at 05/14/18 1554  . ondansetron (ZOFRAN) injection 4 mg  4 mg Intravenous Q6H PRN Gardiner Barefoot, NP   4 mg at 05/14/18 1447  . pantoprazole (PROTONIX) EC tablet 40 mg  40 mg Oral Daily Debbe Odea, MD   40 mg at 05/14/18 0915  . [START ON 05/16/2018] penicillin g benzathine (BICILLIN LA) 1200000 UNIT/2ML injection 2.4 Million Units  2.4 Million Units Intramuscular Once Jani Gravel, MD      . promethazine [facility]) injection 6.25 mg  6.25 mg Intravenous Q6H PRN Jani Gravel, MD   6.25 mg at 05/14/18 2046  . protein supplement (RESOURCE BENEPROTEIN) powder packet 6 g  1 packet Oral TID WC Jani Gravel, MD      . sodium chloride flush (NS) 0.9 % injection 3 mL  3 mL Intravenous Q12H Debbe Odea, MD   3 mL at 05/14/18 0915  . sodium chloride flush (NS) 0.9 % injection 3 mL  3 mL Intravenous PRN Debbe Odea, MD      . sulfamethoxazole-trimethoprim (BACTRIM,SEPTRA) 400-80 MG per tablet 1 tablet  1 tablet Oral Daily Jani Gravel, MD   1 tablet at 05/14/18 1159  . traMADol (ULTRAM) tablet 50 mg  50 mg Oral Q6H PRN Jani Gravel, MD   50 mg at 05/14/18 0915  . zolpidem (AMBIEN) tablet 5 mg  5 mg Oral QHS PRN Gardiner Barefoot, NP   5 mg at 05/13/18 2203    Allergies as of 05/13/2018  . (No Known Allergies)     Review of Systems:    Constitutional: No fever or chills Skin: No itching Cardiovascular: No chest pain Respiratory: No SOB  Gastrointestinal: See HPI and otherwise negative Genitourinary: No dysuria Neurological: No headache Musculoskeletal: No new muscle or joint pain Hematologic: No bleeding Psychiatric: No history of depression or anxiety   Physical Exam:  Vital signs in last 24 hours: Temp:   [97.7 F (36.5 C)-102.5 F (39.2 C)] 98 F (36.7 C) (07/27 0450) Pulse Rate:  [84-140] 84 (07/27 0450) Resp:  [18-22] 18 (07/27 0450) BP: (102-117)/(67-98) 102/74 (07/27 0450) SpO2:  [97 %-100 %] 100 % (07/27 0450) Last BM Date: 05/13/18 General:   Pleasant Caucasian female appears to be  in NAD, Well developed, Well nourished, alert and cooperative Head:  Normocephalic and atraumatic. Eyes:   PEERL, EOMI. No icterus. Conjunctiva pink. Ears:  Normal auditory acuity. Neck:  Supple Throat: Oral cavity and pharynx without inflammation, swelling or lesion. Teeth in good condition. Lungs: Respirations even and unlabored. Lungs clear to auscultation bilaterally.   No wheezes, crackles, or rhonchi.  Heart: Normal S1, S2. No MRG. Regular rate and rhythm. No peripheral edema, cyanosis or pallor.  Abdomen:  Soft, nondistended, nontender. No rebound or guarding. Normal bowel sounds. No appreciable masses or hepatomegaly. Rectal:  Not performed.  Msk:  Symmetrical without gross deformities. Peripheral pulses intact.  Extremities:  Without edema, no deformity or joint abnormality.  Neurologic:  Alert and  oriented x4;  grossly normal neurologically.  Skin:   Dry and intact without significant lesions or rashes. Psychiatric: Demonstrates good judgement and reason without abnormal affect or behaviors.   LAB RESULTS: Recent Labs    05/13/18 1648 05/14/18 0530 05/15/18 0530  WBC 3.4* 3.2* 5.0  HGB 10.2* 9.2* 9.0*  HCT 30.4* 27.5* 26.9*  PLT 121* 123* 113*   BMET Recent Labs    05/13/18 0417 05/14/18 0530 05/15/18 0530  NA 141 140 138  K 3.3* 4.1 3.9  CL 112* 111 107  CO2 21* 23 21*  GLUCOSE 118* 93 111*  BUN 12 10 12   CREATININE 0.75 0.56 0.50  CALCIUM 8.5* 8.2* 7.7*   LFT Recent Labs    05/15/18 0530  PROT 6.4*  ALBUMIN 2.0*  AST 53*  ALT 41  ALKPHOS 264*  BILITOT 3.7*   PT/INR Recent Labs    05/15/18 0530  LABPROT 14.6  INR 1.15    STUDIES: US Abdomen  Limited  Result Date: 05/13/2018 CLINICAL DATA:  Initial evaluation for elevated LFTs, anemia. EXAM: ULTRASOUND ABDOMEN LIMITED RIGHT UPPER QUADRANT COMPARISON:  None. FINDINGS: Gallbladder: Internal sludge without frank cholelithiasis present within the gallbladder lumen. Gallbladder wall thickened up to 6 mm. Small volume free pericholecystic fluid surrounds the gallbladder fundus. No sonographic Murphy sign elicited on exam. Common bile duct: Diameter: 2.8 mm Liver: No focal lesion identified. Within normal limits in parenchymal echogenicity. Portal vein is patent on color Doppler imaging with normal direction of blood flow towards the liver. IMPRESSION: 1. Gallbladder sludge with associated wall thickening and small volume free pericholecystic fluid. Clinical correlation for possible acute cholecystitis recommended. 2. No biliary dilatation. Electronically Signed   By: Jeannine Boga M.D.   On: 05/13/2018 16:28    Impression / Plan:   Impression: 1.  Anemia: Hemoglobin 13.2 in 2018--> 10.9  2/19--> 7.3 on 7/19--> 9.0 now; there was question of PVD with her history of vomiting or question of this being related to AIDS, initial Hemoccult was ordered and negative, anemia panel showed low iron, she was transfused 2 units PRBCs and consulted by hematology 2.  Elevated LFTs: Normal LFTs 9/18, recent hepatitis panel was negative, right upper quadrant ultrasound obtained as above showing gallbladder sludge with associated wall thickening, surgery was consulted, but has now delayed cholecystectomy and asked for our opinion, HIDA scan is pending-question regarding possible medication induced hepatitis, HIV related hepatitis and possibly not her gallbladder; currently alk phos 350-->275-->264, AST 83-->80-->53, ALT 50-->49-->41, total bili 2.0-->3.2-->3.7 3.  Vomiting: Better after antiretroviral, antiemetics held due to severely prolonged QTC, started on Protonix at admission 4.  Anorexia/weight loss: Has  not been eating solid foods 5.  AIDS  Plan: 1.  Continue empiric antibiotics for possible acute  acalculous cholecystitis 2.  Agree with repeat acute hepatitis panel and HCV testing 3.  Please await any further recommendations from Dr. Loletha Carrow later today  Thank you for your kind consultation, we will continue to follow.  Lavone Nian Lemmon  05/15/2018, 9:21 AM Pager #: [phone]

## 2018-05-15 NOTE — Progress Notes (Addendum)
Subjective/Chief Complaint: Pt doing well this AM NPO p MN Pain improved since admission   Objective: Vital signs in last 24 hours: Temp:  [97.7 F (36.5 C)-102.5 F (39.2 C)] 98 F (36.7 C) (07/27 0450) Pulse Rate:  [84-140] 84 (07/27 0450) Resp:  [18-22] 18 (07/27 0450) BP: (102-117)/(67-98) 102/74 (07/27 0450) SpO2:  [97 %-100 %] 100 % (07/27 0450) Last BM Date: 05/13/18  Intake/Output from previous day: 07/26 0701 - 07/27 0700 In: 1094.6 [I.V.:681.3; IV Piggyback:413.3] Out: -  Intake/Output this shift: No intake/output data recorded.  Constitutional: No acute distress, conversant, appears states age. Eyes: Anicteric sclerae, moist conjunctiva, no lid lag Lungs: Clear to auscultation bilaterally, normal respiratory effort CV: regular rate and rhythm, no murmurs, no peripheral edema, pedal pulses 2+ GI: Soft, no masses or hepatosplenomegaly, min-tender to palpation RUQ Skin: No rashes, palpation reveals normal turgor Psychiatric: appropriate judgment and insight, oriented to person, place, and time   Lab Results:  Recent Labs    05/14/18 0530 05/15/18 0530  WBC 3.2* 5.0  HGB 9.2* 9.0*  HCT 27.5* 26.9*  PLT 123* 113*   BMET Recent Labs    05/14/18 0530 05/15/18 0530  NA 140 138  K 4.1 3.9  CL 111 107  CO2 23 21*  GLUCOSE 93 111*  BUN 10 12  CREATININE 0.56 0.50  CALCIUM 8.2* 7.7*   PT/INR Recent Labs    05/15/18 0530  LABPROT 14.6  INR 1.15   ABG No results for input(s): PHART, HCO3 in the last 72 hours.  Invalid input(s): PCO2, PO2  Studies/Results: US Abdomen Limited  Result Date: 05/13/2018 CLINICAL DATA:  Initial evaluation for elevated LFTs, anemia. EXAM: ULTRASOUND ABDOMEN LIMITED RIGHT UPPER QUADRANT COMPARISON:  None. FINDINGS: Gallbladder: Internal sludge without frank cholelithiasis present within the gallbladder lumen. Gallbladder wall thickened up to 6 mm. Small volume free pericholecystic fluid surrounds the gallbladder  fundus. No sonographic Murphy sign elicited on exam. Common bile duct: Diameter: 2.8 mm Liver: No focal lesion identified. Within normal limits in parenchymal echogenicity. Portal vein is patent on color Doppler imaging with normal direction of blood flow towards the liver. IMPRESSION: 1. Gallbladder sludge with associated wall thickening and small volume free pericholecystic fluid. Clinical correlation for possible acute cholecystitis recommended. 2. No biliary dilatation. Electronically Signed   By: Rise Mu M.D.   On: 05/13/2018 16:28    Anti-infectives: Anti-infectives (From admission, onward)   Start     Dose/Rate Route Frequency Ordered Stop   05/16/18 0800  penicillin g benzathine (BICILLIN LA) 1200000 UNIT/2ML injection 2.4 Million Units     2.4 Million Units Intramuscular  Once 05/15/18 0756     05/15/18 0800  penicillin g benzathine (BICILLIN LA) 1200000 UNIT/2ML injection 2.4 Million Units  Status:  Discontinued     2.4 Million Units Intramuscular  Once 05/15/18 0755 05/15/18 0756   05/14/18 1700  Ampicillin-Sulbactam (UNASYN) 3 g in sodium chloride 0.9 % 100 mL IVPB     3 g 200 mL/hr over 30 Minutes Intravenous Every 6 hours 05/14/18 1527     05/14/18 1200  sulfamethoxazole-trimethoprim (BACTRIM,SEPTRA) 400-80 MG per tablet 1 tablet     1 tablet Oral Daily 05/14/18 1011     05/14/18 1200  bictegravir-emtricitabine-tenofovir AF (BIKTARVY) 50-200-25 MG per tablet 1 tablet     1 tablet Oral Daily 05/14/18 1011     05/14/18 1000  cefTRIAXone (ROCEPHIN) 2 g in sodium chloride 0.9 % 100 mL IVPB  Status:  Discontinued  2 g 200 mL/hr over 30 Minutes Intravenous Every 24 hours 05/14/18 0940 05/14/18 1513      Assessment/Plan: Principal Problem:   Anemia, unspecified Active Problems:   AIDS (acquired immune deficiency syndrome) (HCC)   Vomiting   Anorexia   Weight loss   Elevated LFTs   Malnutrition of moderate degree   Acute calculous cholecystitis  Cholecystitis  with transaminitis and hyperbilirubinemia  - Will proceed with HIDA scan -s/p Dr. Selena BattenKim and will consult GI for their input. - Her symptoms could be related to medication induced hepatitis, HIV related hepatitis and may not be related to her GB.  If after above GB still seems like the reason for her pain we can proceed to OR Sun AM.   FEN: Con't NPO for now, OK for FLD after HIDA, NPO p MN ID: [email or web address] VTE: SCD's, chemical prophylaxis per medicine Follow up: TBD     LOS: 2 days    Monica Ehlersamirez Jr., [facility] 05/15/2018

## 2018-05-15 NOTE — Progress Notes (Addendum)
Patient ID: Monica Henson, female   DOB: 1995-11-08, 22 y.o.   MRN: [id number]                                                                PROGRESS NOTE                                                                                                                                                                                                             Patient Demographics:    Monica Henson, is a 22 y.o. female, DOB - 19-Oct-1996, TMH:[id number]  Admit date - 05/13/2018   Admitting Physician Monica Odea, MD  Outpatient Primary MD for the patient is System, Pcp Not In  LOS - 2  Outpatient Specialists:     Chief Complaint  Patient presents with  . Shortness of Breath  . Chest Pain       Brief Narrative  22 y.o.femalewith medical history ofHIV-1,anorexia, vomiting30 lb wt loss, [id number] started on Biktarvy and Bactrim who presents with dyspnea and is found to have a Hb of 6.9. She has been feeling short of breath for the past two days with minor exertion. No cough. Last night she had a pain in the center of her chest which was sharp and severe and radiated under her right breast and then went away. No other occurrences of chest pain.  In regards to her anemia, she states she has light periods and no blood in her stool. No excess bruising. She has been vomiting over the past 2 wks which has improved after starting the AIDS medications. She has not had a taste for food and when she does try to eat solid food, feels nauseated. No regurgitation. No blood in her vomitus.  ED Course: Temp 99.3, pulse 132, BP as low as 99/70 WBC 2.6, Hb 6.9, plt 121- K 3.3, ALk phos 350, AST 83, ALT 50, T bili 2.0 Iron 24, TIBC 331, saturation ratio 7, Ferritin 627, Retic % < 0.04 FOB neg B12 261  CXR unrevealing CTA negative     Subjective:    Monica Henson today has slight nausea and RUQ pain. Awaiting CCY.  Pt afebrile overnite. Rash is improving.  Pt notes starting hiv medication  and bactrim the week prior to rash, ? This could be the cause.  No headache, No chest pain,  No new weakness tingling or numbness, No Cough - SOB.    Assessment  & Plan :    Principal Problem:   Anemia, unspecified Active Problems:   AIDS (acquired immune deficiency syndrome) (HCC)   Vomiting   Anorexia   Weight loss   Elevated LFTs   Malnutrition of moderate degree   Acute calculous cholecystitis   Anemia (LDH 272, Iron saturation 7%, B12 =261 ) Transfused  2 units prbc 7/25 Hgb 6.9=> 10.2 Consider celiac panel, defer to hematology Hematology consulted by prior physician, appreciate input Cbc in am  Thrombocytopenia Stable  Rash Will monitor,  Consider stopping her HIV medication and Bactrim if gets worse they were started on 7/26  Syphyllis exposure Pcn G as per ID recommendations. Will give today  AIDS (acquired immune deficiency syndrome) crptococcal antigen- negative CD4, viral load pending Cont Biktarvy and Bactrim ID input appreciated   Vomiting with elevated LFTs RUQ ultrasound 7/25=>  IMPRESSION: 1. Gallbladder sludge with associated wall thickening and small volume free pericholecystic fluid. Clinical correlation for possible acute cholecystitis recommended. 2. No biliary dilatation. Cont Protonix Surgery consult regarding gallbladder, appreciate input Rocephin=> unasyn 7/26=> NPO Anticipating surgery today but surgery defers til tomorrow HIDA scan Acute hepatitis panel Hep C viral load GI consult appreciated in regards to her abnormal liver function.    Anorexia Weight loss she will not eat solid food but takes liquids well and has mainly been drinking water and juice he does not like Ensure or Boost as they are too sweet Start Glucerna and obtain nutrition consult    DVT prophylaxis:SCDs Code Status:full code Family Communication: Disposition Plan:   home tomorrow Consults called:hematology, surgery, ID Admission  status:inpatient        Lab Results  Component Value Date   PLT 113 (L) 05/15/2018    Antibiotics  :  Rocephin 7/24=7/25, unasyn 7/26=>  Anti-infectives (From admission, onward)   Start     Dose/Rate Route Frequency Ordered Stop   05/14/18 1700  Ampicillin-Sulbactam (UNASYN) 3 g in sodium chloride 0.9 % 100 mL IVPB     3 g 200 mL/hr over 30 Minutes Intravenous Every 6 hours 05/14/18 1527     05/14/18 1200  sulfamethoxazole-trimethoprim (BACTRIM,SEPTRA) 400-80 MG per tablet 1 tablet     1 tablet Oral Daily 05/14/18 1011     05/14/18 1200  bictegravir-emtricitabine-tenofovir AF (BIKTARVY) 50-200-25 MG per tablet 1 tablet     1 tablet Oral Daily 05/14/18 1011     05/14/18 1000  cefTRIAXone (ROCEPHIN) 2 g in sodium chloride 0.9 % 100 mL IVPB  Status:  Discontinued     2 g 200 mL/hr over 30 Minutes Intravenous Every 24 hours 05/14/18 0940 05/14/18 1513        Objective:   Vitals:   05/14/18 2147 05/14/18 2220 05/15/18 0130 05/15/18 0450  BP:    102/74  Pulse:  (!) 110  84  Resp:  20  18  Temp: (!) 102.5 F (39.2 C) 99.8 F (37.7 C) 97.7 F (36.5 C) 98 F (36.7 C)  TempSrc: Oral Oral Oral Oral  SpO2:    100%  Weight:      Height:        Wt Readings from Last 3 Encounters:  05/13/18 51.6 kg (113 lb 12.1 oz)  05/07/18 53.1 kg (117 lb)  04/24/18 61.2 kg (135 lb)     Intake/Output Summary (Last 24 hours) at 05/15/2018 8676 Last data filed at  05/15/2018 0600 Gross per 24 hour  Intake 1094.58 ml  Output -  Net 1094.58 ml     Physical Exam  Awake Alert, Oriented X 3, No new F.N deficits, Normal affect [place].AT,PERRAL Supple Neck,No JVD, No cervical lymphadenopathy appriciated.  Symmetrical Chest wall movement, Good air movement bilaterally, CTAB RRR,No Gallops,Rubs or new Murmurs, No Parasternal Heave +ve B.Sounds, Abd Soft, No tenderness, No organomegaly appriciated, No rebound - guarding or rigidity. No Cyanosis, Clubbing or edema, rash almost gone on her  arms   Data Review:    CBC Recent Labs  Lab 05/13/18 0417 05/13/18 1648 05/14/18 0530 05/15/18 0530  WBC 2.6* 3.4* 3.2* 5.0  HGB 6.9* 10.2* 9.2* 9.0*  HCT 21.3* 30.4* 27.5* 26.9*  PLT 121* 121* 123* 113*  MCV 82.6 84.2 82.3 83.5  MCH 26.7 28.3 27.5 28.0  MCHC 32.4 33.6 33.5 33.5  RDW 13.6 14.1 14.2 14.1  LYMPHSABS 0.4*  --   --   --   MONOABS 0.2  --   --   --   EOSABS 0.0  --   --   --   BASOSABS 0.0  --   --   --     Chemistries  Recent Labs  Lab 05/13/18 0417 05/14/18 0530 05/15/18 0530  NA 141 140 138  K 3.3* 4.1 3.9  CL 112* 111 107  CO2 21* 23 21*  GLUCOSE 118* 93 111*  BUN _0 CREATININE 0.75 0.56 0.50  CALCIUM 8.5* 8.2* 7.7*  MG 2.0 2.1  --   AST 83* 80* 53*  ALT 50* 49* 41  ALKPHOS 350* 275* 264*  BILITOT 2.0* 3.2* 3.7*   ------------------------------------------------------------------------------------------------------------------ Recent Labs    05/13/18 1648  CHOL 89  HDL <10*  LDLCALC NOT CALCULATED  TRIG 159*  CHOLHDL NOT CALCULATED    No results found for: HGBA1C ------------------------------------------------------------------------------------------------------------------ No results for input(s): TSH, T4TOTAL, T3FREE, THYROIDAB in the last 72 hours.  Invalid input(s): FREET3 ------------------------------------------------------------------------------------------------------------------ Recent Labs    05/13/18 0417  VITAMINB12 261  FOLATE 7.3  FERRITIN 627*  TIBC 331  IRON 24*  RETICCTPCT <0.4*    Coagulation profile Recent Labs  Lab 05/15/18 0530  INR 1.15    No results for input(s): DDIMER in the last 72 hours.  Cardiac Enzymes No results for input(s): CKMB, TROPONINI, MYOGLOBIN in the last 168 hours.  Invalid input(s): CK ------------------------------------------------------------------------------------------------------------------ No results found for: BNP  Inpatient Medications  Scheduled  Meds: . sodium chloride   Intravenous Once  . bictegravir-emtricitabine-tenofovir AF  1 tablet Oral Daily  . feeding supplement (PRO-STAT SUGAR FREE 64)  30 mL Oral BID  . pantoprazole  40 mg Oral Daily  . protein supplement  1 packet Oral TID WC  . sodium chloride flush  3 mL Intravenous Q12H  . sulfamethoxazole-trimethoprim  1 tablet Oral Daily   Continuous Infusions: . sodium chloride    . 0.9 % NaCl with KCl 20 mEq / L 75 mL/hr at 05/14/18 2055  . ampicillin-sulbactam (UNASYN) IV Stopped (05/15/18 0606)   PRN Meds:.sodium chloride, acetaminophen **OR** acetaminophen, gi cocktail, ondansetron, promethazine, sodium chloride flush, traMADol, zolpidem  Micro Results Recent Results (from the past 240 hour(s))  Blood culture (routine x 2)     Status: None (Preliminary result)   Collection Time: 05/13/18  4:17 AM  Result Value Ref Range Status   Specimen Description   Final    BLOOD LEFT ANTECUBITAL Performed at [facility] [address]  [place], [place] [address]    Special Requests   Final    BOTTLES DRAWN AEROBIC AND ANAEROBIC Blood Culture results may not be optimal due to an excessive volume of blood received in culture bottles Performed at [facility] [address] [place], [place] [address]    Culture   Final    NO GROWTH 1 DAY Performed at [facility], [place] [address] [place], [place] [address]    Report Status PENDING  Incomplete  Blood culture (routine x 2)     Status: None (Preliminary result)   Collection Time: 05/13/18  4:17 AM  Result Value Ref Range Status   Specimen Description   Final    BLOOD RIGHT ANTECUBITAL Performed at [facility] [address] [place], [place] [address]    Special Requests   Final    BOTTLES DRAWN AEROBIC AND ANAEROBIC Blood Culture adequate volume Performed at [facility] [address], [place], [place] [address]    Culture   Final    NO  GROWTH 1 DAY Performed at [facility], [place] [address], [place], [place] [address]    Report Status PENDING  Incomplete    Radiology Reports Dg Chest 2 View  Result Date: 05/13/2018 CLINICAL DATA:  Mid chest pain and shortness of breath for 48 hours. Fever tonight. EXAM: CHEST - 2 VIEW COMPARISON:  None. FINDINGS: The heart size and mediastinal contours are within normal limits. Both lungs are clear. The visualized skeletal structures are unremarkable. IMPRESSION: No active cardiopulmonary disease. Electronically Signed   By: Lucienne Capers M.D.   On: 05/13/2018 05:19   Ct Angio Chest Pe W And/or Wo Contrast  Result Date: 05/13/2018 CLINICAL DATA:  Chest pain and shortness of breath. Recent diagnosis of HIV. Asthma, acute, pneumonia or pneumothorax suspected EXAM: CT ANGIOGRAPHY CHEST WITH CONTRAST TECHNIQUE: Multidetector CT imaging of the chest was performed using the standard protocol during bolus administration of intravenous contrast. Multiplanar CT image reconstructions and MIPs were obtained to evaluate the vascular anatomy. CONTRAST:  22m ISOVUE-370 IOPAMIDOL (ISOVUE-370) INJECTION 76% COMPARISON:  None. FINDINGS: Cardiovascular: Satisfactory opacification of the pulmonary arteries to the segmental level. No evidence of pulmonary embolism. Normal heart size. No pericardial effusion. Mediastinum/Nodes: Negative.  No adenopathy or mass Lungs/Pleura: There is no edema, consolidation, effusion, or pneumothorax. Upper Abdomen: Negative Musculoskeletal: Negative Review of the MIP images confirms the above findings. IMPRESSION: Normal exam. Electronically Signed   By: JMonte FantasiaM.D.   On: 05/13/2018 07:30   UKoreaAbdomen Limited  Result Date: 05/13/2018 CLINICAL DATA:  Initial evaluation for elevated LFTs, anemia. EXAM: ULTRASOUND ABDOMEN LIMITED RIGHT UPPER QUADRANT COMPARISON:  None. FINDINGS: Gallbladder: Internal sludge without frank cholelithiasis present within the gallbladder  lumen. Gallbladder wall thickened up to 6 mm. Small volume free pericholecystic fluid surrounds the gallbladder fundus. No sonographic Murphy sign elicited on exam. Common bile duct: Diameter: 2.8 mm Liver: No focal lesion identified. Within normal limits in parenchymal echogenicity. Portal vein is patent on color Doppler imaging with normal direction of blood flow towards the liver. IMPRESSION: 1. Gallbladder sludge with associated wall thickening and small volume free pericholecystic fluid. Clinical correlation for possible acute cholecystitis recommended. 2. No biliary dilatation. Electronically Signed   By: BJeannine BogaM.D.   On: 05/13/2018 16:28    Time Spent in minutes  30   JJani GravelM.D on 05/15/2018 at 7:48 AM  Between 7am to 7pm - Pager -  [phone]    After 7pm go to [email or web address] - password [facility]  [facility] -  Office  [phone]

## 2018-05-16 DIAGNOSIS — B20 Human immunodeficiency virus [HIV] disease: Secondary | ICD-10-CM

## 2018-05-16 DIAGNOSIS — R945 Abnormal results of liver function studies: Secondary | ICD-10-CM

## 2018-05-16 DIAGNOSIS — D649 Anemia, unspecified: Principal | ICD-10-CM

## 2018-05-16 LAB — COMPREHENSIVE METABOLIC PANEL
ALK PHOS: 257 U/L — AB (ref 38–126)
ALT: 34 U/L (ref 0–44)
ANION GAP: 8 (ref 5–15)
AST: 46 U/L — ABNORMAL HIGH (ref 15–41)
Albumin: 2 g/dL — ABNORMAL LOW (ref 3.5–5.0)
BUN: 13 mg/dL (ref 6–20)
CALCIUM: 7.8 mg/dL — AB (ref 8.9–10.3)
CO2: 21 mmol/L — AB (ref 22–32)
Chloride: 107 mmol/L (ref 98–111)
Creatinine, Ser: 0.51 mg/dL (ref 0.44–1.00)
GFR calc Af Amer: >60 mL/min (ref >60)
GFR calc non Af Amer: >60 mL/min (ref >60)
GLUCOSE: 79 mg/dL (ref 70–99)
Potassium: 3.7 mmol/L (ref 3.5–5.1)
SODIUM: 136 mmol/L (ref 135–145)
Total Bilirubin: 2.1 mg/dL — ABNORMAL HIGH (ref 0.3–1.2)
Total Protein: 6.4 g/dL — ABNORMAL LOW (ref 6.5–8.1)

## 2018-05-16 LAB — HCV RNA QUANT: HCV QUANT: NOT DETECTED [IU]/mL (ref >50)

## 2018-05-16 LAB — HEPATITIS PANEL, ACUTE
Hep A IgM: NEGATIVE
Hep B C IgM: NEGATIVE
Hepatitis B Surface Ag: NEGATIVE

## 2018-05-16 LAB — CBC
HCT: 25.1 % — ABNORMAL LOW (ref 36.0–46.0)
HEMOGLOBIN: 8.2 g/dL — AB (ref 12.0–15.0)
MCH: 27.8 pg (ref 26.0–34.0)
MCHC: 32.7 g/dL (ref 30.0–36.0)
MCV: 85.1 fL (ref 78.0–100.0)
Platelets: 139 10*3/uL — ABNORMAL LOW (ref 150–400)
RBC: 2.95 MIL/uL — AB (ref 3.87–5.11)
RDW: 14.4 % (ref 11.5–15.5)
WBC: 4.5 10*3/uL (ref 4.0–10.5)

## 2018-05-16 MED ORDER — VITAMIN B-12 1000 MCG PO TABS: 2000.0000 ug | ORAL_TABLET | Freq: Every day | ORAL | 0 refills | Status: DC

## 2018-05-16 MED ORDER — FERROUS SULFATE 325 (65 FE) MG PO TABS: 325.0000 mg | ORAL_TABLET | Freq: Every day | ORAL | 0 refills | Status: DC

## 2018-05-16 MED ORDER — PROMETHAZINE HCL 12.5 MG PO TABS: 12.5000 mg | ORAL_TABLET | Freq: Four times a day (QID) | ORAL | 0 refills | Status: DC | PRN

## 2018-05-16 MED ORDER — PRO-STAT SUGAR FREE PO LIQD: 30.0000 mL | Freq: Two times a day (BID) | ORAL | 0 refills | Status: DC

## 2018-05-16 MED ORDER — TRAMADOL HCL 50 MG PO TABS: 50.0000 mg | ORAL_TABLET | Freq: Four times a day (QID) | ORAL | 0 refills | Status: DC | PRN

## 2018-05-16 MED ORDER — PANTOPRAZOLE SODIUM 40 MG PO TBEC: 40.0000 mg | DELAYED_RELEASE_TABLET | Freq: Every day | ORAL | 0 refills | Status: DC

## 2018-05-16 NOTE — Progress Notes (Addendum)
Subjective/Chief Complaint: Pt doing well with no abd pain this AM HIDA results reviewed personally   Objective: Vital signs in last 24 hours: Temp:  [97.6 F (36.4 C)-98.6 F (37 C)] 97.6 F (36.4 C) (07/28 0509) Pulse Rate:  [86-103] 86 (07/28 0509) Resp:  [16] 16 (07/27 2135) BP: (101-120)/(78-82) 101/78 (07/28 0509) SpO2:  [100 %] 100 % (07/28 0509) Last BM Date: 05/13/18  Intake/Output from previous day: No intake/output data recorded. Intake/Output this shift: No intake/output data recorded.  Constitutional: No acute distress, conversant, appears states age. Eyes: Anicteric sclerae, moist conjunctiva, no lid lag Lungs: Clear to auscultation bilaterally, normal respiratory effort CV: regular rate and rhythm, no murmurs, no peripheral edema, pedal pulses 2+ GI: Soft, no masses or hepatosplenomegaly, non-tender to palpation Skin: No rashes, palpation reveals normal turgor Psychiatric: appropriate judgment and insight, oriented to person, place, and time   Lab Results:  Recent Labs    05/15/18 0530 05/16/18 0500  WBC 5.0 4.5  HGB 9.0* 8.2*  HCT 26.9* 25.1*  PLT 113* 139*   BMET Recent Labs    05/15/18 0530 05/16/18 0500  NA 138 136  K 3.9 3.7  CL 107 107  CO2 21* 21*  GLUCOSE 111* 79  BUN 12 13  CREATININE 0.50 0.51  CALCIUM 7.7* 7.8*   PT/INR Recent Labs    05/15/18 0530  LABPROT 14.6  INR 1.15   Studies/Results: Nm Hepatobiliary Liver Func  Result Date: 05/15/2018 CLINICAL DATA:  Right upper quadrant pain nausea vomiting lack of appetite abnormal LFT EXAM: NUCLEAR MEDICINE HEPATOBILIARY IMAGING TECHNIQUE: Sequential images of the abdomen were obtained out to 60 minutes following intravenous administration of radiopharmaceutical. RADIOPHARMACEUTICALS:  7.49 mCi Tc-52m Choletec IV. Intravenous morphine given after first hour of the examination. COMPARISON:  Ultrasound 05/13/2018 FINDINGS: Prompt uptake and biliary excretion of activity by the  liver is seen. Gallbladder activity is visualized following morphine administration, consistent with patency of cystic duct. Biliary activity passes into small bowel, consistent with patent common bile duct. IMPRESSION: 1. Gallbladder is promptly visualized following morphine augmentation, arguing against acute cholecystitis. 2. Delayed gallbladder visualization can be seen in the setting of chronic gallbladder disease/chronic cholecystitis. Electronically Signed   By: Jasmine Pang M.D.   On: 05/15/2018 21:09    Anti-infectives: Anti-infectives (From admission, onward)   Start     Dose/Rate Route Frequency Ordered Stop   05/16/18 0800  penicillin g benzathine (BICILLIN LA) 1200000 UNIT/2ML injection 2.4 Million Units     2.4 Million Units Intramuscular  Once 05/15/18 0756     05/16/18 0800  penicillin g benzathine (BICILLIN LA) 1200000 UNIT/2ML injection 2.4 Million Units  Status:  Discontinued     2.4 Million Units Intramuscular  Once 05/15/18 2006 05/15/18 2006   05/15/18 0800  penicillin g benzathine (BICILLIN LA) 1200000 UNIT/2ML injection 2.4 Million Units  Status:  Discontinued     2.4 Million Units Intramuscular  Once 05/15/18 0755 05/15/18 0756   05/14/18 1700  Ampicillin-Sulbactam (UNASYN) 3 g in sodium chloride 0.9 % 100 mL IVPB     3 g 200 mL/hr over 30 Minutes Intravenous Every 6 hours 05/14/18 1527     05/14/18 1200  sulfamethoxazole-trimethoprim (BACTRIM,SEPTRA) 400-80 MG per tablet 1 tablet     1 tablet Oral Daily 05/14/18 1011     05/14/18 1200  bictegravir-emtricitabine-tenofovir AF (BIKTARVY) 50-200-25 MG per tablet 1 tablet     1 tablet Oral Daily 05/14/18 1011     05/14/18 1000  cefTRIAXone (  ROCEPHIN) 2 g in sodium chloride 0.9 % 100 mL IVPB  Status:  Discontinued     2 g 200 mL/hr over 30 Minutes Intravenous Every 24 hours 05/14/18 0940 05/14/18 1513      Assessment/Plan: Principal Problem:   Pancytopenia Active Problems:   AIDS (acquired immune deficiency syndrome)  (HCC)   Vomiting   Anorexia   Weight loss   Elevated LFTs   Malnutrition of moderate degree    Hepatitis -  I think acute acalculous ccy has been ruled out. 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

## 2018-05-16 NOTE — Discharge Summary (Signed)
Monica Henson, is a 22 y.o. female  DOB 1996-08-30  MRN [id number].  Admission date:  05/13/2018  Admitting Physician  Debbe Odea, MD  Discharge Date:  05/16/2018   Primary MD  System, Pcp Not In  Recommendations for primary care physician for things to follow:    Anemia(LDH 272, Iron saturation 7%, B12 =261 ) Transfused 2 units prbc 7/25 Hgb 6.9=> 10.2 => 8.2 Consider celiac panel, defer to hematology Ferrous sulfate 362m po qday B12 2000 micrograms po qday Please f/u with Dr. SBenay Spicein 2  Weeks to check cbc  Thrombocytopenia Stable  Rash resolving ? 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 Gallbladder sludge with associated wall thickening and small volume free pericholecystic fluid. Clinical correlation for possible acute cholecystitis recommended. 2. No biliary dilatation. HIDA 7/27=> negative Please check cmp in 1 week and f/u on hepatitis C viral load with ID ContProtonix 427mpo qday Phenergan 12.80m54mo q6h prn    Anorexia Weight loss Severe protein calorie malnutrition prostat        Admission Diagnosis  Elevated LFTs [R94.5] Symptomatic HIV infection (HCC) [B20] Anemia, unspecified type [D64.9] Dyspnea, unspecified type [R06.00]   Discharge Diagnosis  Elevated LFTs [R94.5] Symptomatic HIV infection ([place]] Anemia, unspecified type [D64.9] Dyspnea, unspecified type  [R06.00]     Principal Problem:   Anemia, unspecified Active Problems:   AIDS (acquired immune deficiency syndrome) (HCC)   Vomiting   Anorexia   Weight loss   Elevated LFTs   Malnutrition of moderate degree   Acute calculous cholecystitis      Past Medical History:  Diagnosis Date  . Anemia   . Elevated LFTs   . [facility]   Past Surgical History:  Procedure Laterality Date  . CYST REMOVAL HAND         HPI  from the history and physical done on the day of admission:     [age]o.femalewith medical history ofHIV-1,anorexia, vomiting30 lb wt loss, CD4128recently started on Biktarvy and Bactrim who presents with dyspnea and is found to have a Hb of 6.9. She has been feeling short of breath for the past two days with minor exertion. No cough. Last night she had a pain in the center of her chest which was sharp and severe and radiated under her right breast and then went away. No other occurrences of chest pain.  In regards to her anemia, she states she has light periods and no blood in her stool. No excess bruising. She has been  vomiting over the past 2 wks which has improved after starting the AIDS medications. She has not had a taste for food and when she does try to eat solid food, feels nauseated. No regurgitation. No blood in her vomitus.  ED Course: Temp 99.3, pulse 132, BP as low as 99/70 WBC 2.6, Hb 6.9, plt 121- K 3.3, ALk phos 350, AST 83, ALT 50, T bili 2.0 Iron 24, TIBC 331, saturation ratio 7, Ferritin 627, Retic % < 0.04 FOB neg B12 261  CXR unrevealing CTA negative      Hospital Course:      Pt was admitted for n/v, anoreixa and found to be anemic.  Wbc 2.6, Hgb 6.9, Plt 121 (7/25)  Pt was transfused  w 2 units prbc.   Pt was seen by oncology Dr. Benay Spice and he recommended starting ferrous sulfate.  ID was consulted and recommended PCN G for syphyllis exposure even though RPR negative, and this was given on 7/28.  Her rash on her  arms, and palms and body have improved since admission.  No itching at this time.  Pt was seen by surgery for possible cholecystitis on RUQ 7/25 but HIDA was negative. Gi was consulted at the recommendation of surgery and thought that most of her liver issues were due to HIV. Acute hepatitis panel was negative. Hepatitis C viral load pending.  Pt is now tolerating regular diet and would like to be discharge to home today.   Follow UP  Follow-up Information    [facility]              Follow up in 1 week(s).   Contact information: [place] [phone]       primary care physician Follow up in 1 week(s).            Consults obtained - oncology, ID, surgery, GI  Discharge Condition: stable  Diet and Activity recommendation: See Discharge Instructions below  Discharge Instructions          Discharge Medications     Allergies as of 05/16/2018   No Known Allergies     Medication List    STOP taking these medications   ibuprofen 200 MG tablet Commonly known as:  ADVIL,MOTRIN   ibuprofen 600 MG tablet Commonly known as:  ADVIL,MOTRIN     TAKE these medications   bictegravir-emtricitabine-tenofovir AF 50-200-25 MG Tabs tablet Commonly known as:  BIKTARVY Take 1 tablet by mouth daily.   feeding supplement (PRO-STAT SUGAR FREE 64) Liqd Take 30 mLs by mouth 2 (two) times daily.   ferrous sulfate 325 (65 FE) MG tablet Take 1 tablet (325 mg total) by mouth daily.   pantoprazole 40 MG tablet Commonly known as:  PROTONIX Take 1 tablet (40 mg total) by mouth daily.   promethazine 12.5 MG tablet Commonly known as:  PHENERGAN Take 1 tablet (12.5 mg total) by mouth every 6 (six) hours as needed for nausea or vomiting.   sulfamethoxazole-trimethoprim 400-80 MG tablet Commonly known as:  BACTRIM Take 1 tablet by mouth daily.   vitamin B-12 1000 MCG tablet Commonly known as:  CYANOCOBALAMIN Take 2  tablets (2,000 mcg total) by mouth daily.       Major procedures and Radiology Reports - PLEASE review detailed and final reports for all details, in brief -       Dg Chest 2 View  Result Date: 05/13/2018 CLINICAL DATA:  Mid chest pain and  shortness of breath for 48 hours. Fever tonight. EXAM: CHEST - 2 VIEW COMPARISON:  None. FINDINGS: The heart size and mediastinal contours are within normal limits. Both lungs are clear. The visualized skeletal structures are unremarkable. IMPRESSION: No active cardiopulmonary disease. Electronically Signed   By: Lucienne Capers M.D.   On: 05/13/2018 05:19   Ct Angio Chest Pe W And/or Wo Contrast  Result Date: 05/13/2018 CLINICAL DATA:  Chest pain and shortness of breath. Recent diagnosis of HIV. Asthma, acute, pneumonia or pneumothorax suspected EXAM: CT ANGIOGRAPHY CHEST WITH CONTRAST TECHNIQUE: Multidetector CT imaging of the chest was performed using the standard protocol during bolus administration of intravenous contrast. Multiplanar CT image reconstructions and MIPs were obtained to evaluate the vascular anatomy. CONTRAST:  53m ISOVUE-370 IOPAMIDOL (ISOVUE-370) INJECTION 76% COMPARISON:  None. FINDINGS: Cardiovascular: Satisfactory opacification of the pulmonary arteries to the segmental level. No evidence of pulmonary embolism. Normal heart size. No pericardial effusion. Mediastinum/Nodes: Negative.  No adenopathy or mass Lungs/Pleura: There is no edema, consolidation, effusion, or pneumothorax. Upper Abdomen: Negative Musculoskeletal: Negative Review of the MIP images confirms the above findings. IMPRESSION: Normal exam. Electronically Signed   By: JMonte FantasiaM.D.   On: 05/13/2018 07:30   Nm Hepatobiliary Liver Func  Result Date: 05/15/2018 CLINICAL DATA:  Right upper quadrant pain nausea vomiting lack of appetite abnormal LFT EXAM: NUCLEAR MEDICINE HEPATOBILIARY IMAGING TECHNIQUE: Sequential images of the abdomen were obtained out to 60 minutes  following intravenous administration of radiopharmaceutical. RADIOPHARMACEUTICALS:  7.49 mCi Tc-97mholetec IV. Intravenous morphine given after first hour of the examination. COMPARISON:  Ultrasound 05/13/2018 FINDINGS: Prompt uptake and biliary excretion of activity by the liver is seen. Gallbladder activity is visualized following morphine administration, consistent with patency of cystic duct. Biliary activity passes into small bowel, consistent with patent common bile duct. IMPRESSION: 1. Gallbladder is promptly visualized following morphine augmentation, arguing against acute cholecystitis. 2. Delayed gallbladder visualization can be seen in the setting of chronic gallbladder disease/chronic cholecystitis. Electronically Signed   By: KiDonavan Foil.D.   On: 05/15/2018 21:09   UsKoreabdomen Limited  Result Date: 05/13/2018 CLINICAL DATA:  Initial evaluation for elevated LFTs, anemia. EXAM: ULTRASOUND ABDOMEN LIMITED RIGHT UPPER QUADRANT COMPARISON:  None. FINDINGS: Gallbladder: Internal sludge without frank cholelithiasis present within the gallbladder lumen. Gallbladder wall thickened up to 6 mm. Small volume free pericholecystic fluid surrounds the gallbladder fundus. No sonographic Murphy sign elicited on exam. Common bile duct: Diameter: 2.8 mm Liver: No focal lesion identified. Within normal limits in parenchymal echogenicity. Portal vein is patent on color Doppler imaging with normal direction of blood flow towards the liver. IMPRESSION: 1. Gallbladder sludge with associated wall thickening and small volume free pericholecystic fluid. Clinical correlation for possible acute cholecystitis recommended. 2. No biliary dilatation. Electronically Signed   By: BeJeannine Boga.D.   On: 05/13/2018 16:28    Micro Results      Recent Results (from the past 240 hour(s))  Blood culture (routine x 2)     Status: None (Preliminary result)   Collection Time: 05/13/18  4:17 AM  Result Value Ref Range  Status   Specimen Description   Final    BLOOD LEFT ANTECUBITAL [facility]formed at [facility] [place] [id number]  Special Requests   Final    BOTTLES DRAWN AEROBIC AND ANAEROBIC Blood Culture results may not be optimal due to an excessive volume of blood received in culture bottles [facility]  [place] [address] [place], [place] [address]    Culture   Final    NO GROWTH 2 DAYS Performed at [place] [facility] [address] [place], [place] [address]    Report Status PENDING  Incomplete  Blood culture (routine x 2)     Status: None (Preliminary result)   Collection Time: 05/13/18  4:17 AM  Result Value Ref Range Status   Specimen Description   Final    BLOOD RIGHT ANTECUBITAL Performed at [facility] [address] [place], [place] [address]    Special Requests   Final    BOTTLES DRAWN AEROBIC AND ANAEROBIC Blood Culture adequate volume Performed at [facility] [address] [place], [place] [address]    Culture   Final    NO GROWTH 2 DAYS Performed at [place] [facility] [address], [place], [place] [address]    Report Status PENDING  Incomplete       Today   Subjective    Raynell Upton today has no n/v, no abd pain, no diarrhea, no brbpr, rash improved.    No headache,no chest  pain,no new weakness tingling or numbness, feels much better wants to go home today.    Objective   Blood pressure 101/78, pulse 86, temperature 97.6 F (36.4 C), resp. rate 16, height _0  (1.676 m), weight 51.6 kg (113 lb 12.1 oz), last menstrual period 05/12/2018, SpO2 100 %.  No intake or output data in the 24 hours ending 05/16/18 1015  Exam Awake Alert, Oriented x 3, No new F.N deficits, Normal affect [place].AT,PERRAL Supple Neck,No JVD, No cervical lymphadenopathy appriciated.  Symmetrical Chest wall movement, Good air movement bilaterally, CTAB RRR,No  Gallops,Rubs or new Murmurs, No Parasternal Heave +ve B.Sounds, Abd Soft, Non tender, No organomegaly appriciated, No rebound -guarding or rigidity. No Cyanosis, Clubbing or edema, No new Rash or bruise   Data Review   CBC w Diff:  Lab Results  Component Value Date   WBC 4.5 05/16/2018   HGB 8.2 (L) 05/16/2018   HCT 25.1 (L) 05/16/2018   PLT 139 (L) 05/16/2018   LYMPHOPCT 17 05/13/2018   MONOPCT 9 05/13/2018   EOSPCT 0 05/13/2018   BASOPCT 0 05/13/2018    CMP:  Lab Results  Component Value Date   NA 136 05/16/2018   K 3.7 05/16/2018   CL 107 05/16/2018   CO2 21 (L) 05/16/2018   BUN 13 05/16/2018   CREATININE 0.51 05/16/2018   CREATININE 0.68 05/07/2018   PROT 6.4 (L) 05/16/2018   ALBUMIN 2.0 (L) 05/16/2018   BILITOT 2.1 (H) 05/16/2018   ALKPHOS 257 (H) 05/16/2018   AST 46 (H) 05/16/2018   ALT 34 05/16/2018  .   Total Time in preparing paper work, data evaluation and todays exam - 28 minutes  Jani Gravel M.D on 05/16/2018 at 10:15 AM  [facility]   Office  [phone]

## 2018-05-16 NOTE — Progress Notes (Addendum)
Progress Note   Subjective  Chief Complaint: Elevated LFT, Nausea and Vomiting  Today, reports that she feels much better, able to tolerate some salad for dinner last night with no nausea/vomiting. Asks when she can go home.     Objective   Vital signs in last 24 hours: Temp:  [97.6 F (36.4 C)-98.6 F (37 C)] 97.6 F (36.4 C) (07/28 0509) Pulse Rate:  [86-103] 86 (07/28 0509) Resp:  [16] 16 (07/27 2135) BP: (101-120)/(78-82) 101/78 (07/28 0509) SpO2:  [100 %] 100 % (07/28 0509) Last BM Date: 05/13/18 General:    white female in NAD Heart:  Regular rate and rhythm; no murmurs Lungs: Respirations even and unlabored, lungs CTA bilaterally Abdomen:  Soft, nontender and nondistended. Normal bowel sounds. Extremities:  Without edema. Neurologic:  Alert and oriented,  grossly normal neurologically. Psych:  Cooperative. Normal mood and affect.  Lab Results: Recent Labs    05/14/18 0530 05/15/18 0530 05/16/18 0500  WBC 3.2* 5.0 4.5  HGB 9.2* 9.0* 8.2*  HCT 27.5* 26.9* 25.1*  PLT 123* 113* 139*   BMET Recent Labs    05/14/18 0530 05/15/18 0530 05/16/18 0500  NA 140 138 136  K 4.1 3.9 3.7  CL 111 107 107  CO2 23 21* 21*  GLUCOSE 93 111* 79  BUN 10 12 13   CREATININE 0.56 0.50 0.51  CALCIUM 8.2* 7.7* 7.8*   Hepatic Function Latest Ref Rng & Units 05/16/2018 05/15/2018 05/14/2018  Total Protein 6.5 - 8.1 g/dL 6.4(L) 6.4(L) 6.9  Albumin 3.5 - 5.0 g/dL 2.0(L) 2.0(L) 2.3(L)  AST 15 - 41 U/L 46(H) 53(H) 80(H)  ALT 0 - 44 U/L 34 41 49(H)  Alk Phosphatase 38 - 126 U/L 257(H) 264(H) 275(H)  Total Bilirubin 0.3 - 1.2 mg/dL 2.1(H) 3.7(H) 3.2(H)   PT/INR Recent Labs    05/15/18 0530  LABPROT 14.6  INR 1.15    Studies/Results: Nm Hepatobiliary Liver Func  Result Date: 05/15/2018 CLINICAL DATA:  Right upper quadrant pain nausea vomiting lack of appetite abnormal LFT EXAM: NUCLEAR MEDICINE HEPATOBILIARY IMAGING TECHNIQUE: Sequential images of the abdomen were obtained  out to 60 minutes following intravenous administration of radiopharmaceutical. RADIOPHARMACEUTICALS:  7.49 mCi Tc-35mCholetec IV. Intravenous morphine given after first hour of the examination. COMPARISON:  Ultrasound 05/13/2018 FINDINGS: Prompt uptake and biliary excretion of activity by the liver is seen. Gallbladder activity is visualized following morphine administration, consistent with patency of cystic duct. Biliary activity passes into small bowel, consistent with patent common bile duct. IMPRESSION: 1. Gallbladder is promptly visualized following morphine augmentation, arguing against acute cholecystitis. 2. Delayed gallbladder visualization can be seen in the setting of chronic gallbladder disease/chronic cholecystitis. Electronically Signed   By: KDonavan FoilM.D.   On: 05/15/2018 21:09       Assessment / Plan:   Assessment: 1. Elevated LFT: see Dr. DCorena Pilgrimdiscussion yesterday, acute viral hepatitis panel negative, could have been from antibiotics, with multiple possibilities it is hard to determine at this time, but they are improving 2. Nausea and Vomiting: resolved-could have been related to HIV 3. AIDS 4. Anorexia and weight loss 5. Anemia: hgb 9.0-->8.2 overnight-thought from chronic disease-HIV  Plan: 1. LFT's continue to trend down and patient now able to tolerate solid food. She could be discharged from GI standpoint with follow of her LFT's by her PCP. 2. Also, antibiotics started for acalculous cholecystitis need to be discontinued if surgery does not believe this is etiology of elevated LFT's. 3. Please await  any final recommendations from Dr. Loletha Carrow later today  Thank you for your kind consultation, we will likely sign off.    LOS: 3 days   Monica Henson  05/16/2018, 9:26 AM  Pager # [phone]  I have discussed the case with the PA, and that is the plan I formulated. I personally interviewed and examined the patient.  LFTs stable.  No cholecystitis, so  Abx stopped and no cholecystectomy. Cause of RUQ pain unclear, but has resolved.  LFT elevation also uncertain , multiple possibilities.  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 Will follow as outpatient.  Signing off - home today.    Monica Henson Office: [phone]

## 2018-05-18 ENCOUNTER — Encounter: Payer: Self-pay | Admitting: Family

## 2018-05-18 LAB — CULTURE, BLOOD (ROUTINE X 2)
CULTURE: NO GROWTH
Culture: NO GROWTH
SPECIAL REQUESTS: ADEQUATE

## 2018-05-18 LAB — HIV-1 RNA ULTRAQUANT REFLEX TO GENTYP+
HIV 1 RNA Quant: 4960000 copies/mL — ABNORMAL HIGH
HIV-1 RNA Quant, Log: 6.7 Log copies/mL — ABNORMAL HIGH

## 2018-05-18 LAB — HIV-1 GENOTYPE: HIV-1 GENOTYPE: DETECTED — AB

## 2018-05-21 ENCOUNTER — Encounter: Payer: Self-pay | Admitting: *Deleted

## 2018-05-27 ENCOUNTER — Ambulatory Visit: Payer: Medicaid Other

## 2018-05-27 ENCOUNTER — Encounter: Payer: Medicaid Other | Admitting: Family

## 2018-05-27 ENCOUNTER — Ambulatory Visit: Payer: Medicaid Other | Admitting: Licensed Clinical Social Worker

## 2018-05-31 MED FILL — BIKTARVY 50-200-25 MG TABS: 50-200-25 | 30 days supply | Qty: 30 | Fill #1

## 2018-06-18 NOTE — Progress Notes (Deleted)
   Subjective:    Patient ID: Monica Henson, female    DOB: 02/25/1996, 22 y.o.   MRN: [id number]  No chief complaint on file.    HPI:  Monica DoomsKelsey Spreen is a 22 y.o. female who presents today for follow up of her HIV/AIDS disease.  Ms. Barbera Settersoling was last seen in the clinic on 05/07/18 with newly diagnosed HIV-1 disease with a CD4 count of 128 and viral load of 38,100. She was started on ART with Biktarvy and Bactrim for PCP prophylaxis. She was in the hospital with anemia and treated for Syphilis despite having a negative RPR. She was also tested for Hepatitis C which was negative.    No Known Allergies    Outpatient Medications Prior to Visit  Medication Sig Dispense Refill  . Amino Acids-Protein Hydrolys (FEEDING SUPPLEMENT, PRO-STAT SUGAR FREE 64,) LIQD Take 30 mLs by mouth 2 (two) times daily. 900 mL 0  . bictegravir-emtricitabine-tenofovir AF (BIKTARVY) 50-200-25 MG TABS tablet Take 1 tablet by mouth daily. 30 tablet 2  . ferrous sulfate 325 (65 FE) MG tablet Take 1 tablet (325 mg total) by mouth daily. 30 tablet 0  . pantoprazole (PROTONIX) 40 MG tablet Take 1 tablet (40 mg total) by mouth daily. 30 tablet 0  . promethazine (PHENERGAN) 12.5 MG tablet Take 1 tablet (12.5 mg total) by mouth every 6 (six) hours as needed for nausea or vomiting. 30 tablet 0  . sulfamethoxazole-trimethoprim (BACTRIM) 400-80 MG tablet Take 1 tablet by mouth daily. 30 tablet 1  . vitamin B-12 (CYANOCOBALAMIN) 1000 MCG tablet Take 2 tablets (2,000 mcg total) by mouth daily. 60 tablet 0   No facility-administered medications prior to visit.      Past Medical History:  Diagnosis Date  . Anemia   . Elevated LFTs   . HIV infection [facility](HCC)      Past Surgical History:  Procedure Laterality Date  . CYST REMOVAL HAND         Review of Systems    Objective:    There were no vitals taken for this visit. Nursing note and vital signs reviewed.  Physical Exam     Assessment & Plan:   Problem List  Items Addressed This Visit    None       I am having Monica DoomsKelsey Ballester maintain her sulfamethoxazole-trimethoprim, bictegravir-emtricitabine-tenofovir AF, pantoprazole, feeding supplement (PRO-STAT SUGAR FREE 64), promethazine, ferrous sulfate, and vitamin B-12.   No orders of the defined types were placed in this encounter.    Follow-up: No follow-ups on file.   Marcos EkeGreg Calone, MSN, FNP-C Nurse Practitioner [facility] Office phone: [phone] Pager: [phone] [facility] Main number: [phone]

## 2018-06-22 ENCOUNTER — Ambulatory Visit: Payer: Medicaid Other | Admitting: Family

## 2018-06-23 ENCOUNTER — Encounter: Payer: Self-pay | Admitting: Family

## 2018-06-23 ENCOUNTER — Ambulatory Visit (INDEPENDENT_AMBULATORY_CARE_PROVIDER_SITE_OTHER): Payer: Self-pay | Admitting: Family

## 2018-06-23 VITALS — BP 98/59 | HR 97 | Temp 98.4°F | Ht 66.0 in | Wt 117.0 lb

## 2018-06-23 DIAGNOSIS — B20 Human immunodeficiency virus [HIV] disease: Secondary | ICD-10-CM

## 2018-06-23 MED ORDER — SULFAMETHOXAZOLE-TRIMETHOPRIM 400-80 MG PO TABS: 1.0000 | ORAL_TABLET | Freq: Every day | ORAL | 2 refills | Status: DC

## 2018-06-23 MED ORDER — BICTEGRAVIR-EMTRICITAB-TENOFOV 50-200-25 MG PO TABS: 1.0000 | ORAL_TABLET | Freq: Every day | ORAL | 2 refills | Status: DC

## 2018-06-23 NOTE — Patient Instructions (Signed)
Nice to see you!  Congratulations on your new job!  Continue to take your Biktarvy and Bactrim. The plan is to get you off of Bactrim in the next couple of months.   Follow up in 1 month or sooner pending your blood work.

## 2018-06-23 NOTE — Progress Notes (Signed)
Subjective:    Patient ID: Monica Henson, female    DOB: 12-29-1995, 22 y.o.   MRN: [id number]  Chief Complaint  Patient presents with  . Follow-up    HIV  . HIV Positive/AIDS    HPI:  Monica Henson is a 22 y.o. female who presents today  for follow up of her HIV/AIDS disease.  Monica Henson was last seen in the clinic on 05/07/18 with newly diagnosed HIV-1 disease with a CD4 count of 128 and viral load of 38,100. She was started on ART with Biktarvy and Bactrim for PCP prophylaxis. She was in the hospital with anemia and treated for Syphilis despite having a negative RPR. She was also tested for Hepatitis C which was negative. She is due for Prevnar and Meningococcal vaccinations today.   Monica Henson continues to take her Biktarvy and Bactrim as prescribed with no adverse side effects. She has not missed any doses and has no problems obtaining her medication. She is feeling significantly better since her previous office visit. She is starting a new job at [employer] in the coming days.    No Known Allergies    Outpatient Medications Prior to Visit  Medication Sig Dispense Refill  . Amino Acids-Protein Hydrolys (FEEDING SUPPLEMENT, PRO-STAT SUGAR FREE 64,) LIQD Take 30 mLs by mouth 2 (two) times daily. 900 mL 0  . ferrous sulfate 325 (65 FE) MG tablet Take 1 tablet (325 mg total) by mouth daily. 30 tablet 0  . pantoprazole (PROTONIX) 40 MG tablet Take 1 tablet (40 mg total) by mouth daily. 30 tablet 0  . promethazine (PHENERGAN) 12.5 MG tablet Take 1 tablet (12.5 mg total) by mouth every 6 (six) hours as needed for nausea or vomiting. 30 tablet 0  . vitamin B-12 (CYANOCOBALAMIN) 1000 MCG tablet Take 2 tablets (2,000 mcg total) by mouth daily. 60 tablet 0  . bictegravir-emtricitabine-tenofovir AF (BIKTARVY) 50-200-25 MG TABS tablet Take 1 tablet by mouth daily. 30 tablet 2  . sulfamethoxazole-trimethoprim (BACTRIM) 400-80 MG tablet Take 1 tablet by mouth daily. 30 tablet 1   No  facility-administered medications prior to visit.      Past Medical History:  Diagnosis Date  . Anemia   . Elevated LFTs   . HIV infection [facility])      Past Surgical History:  Procedure Laterality Date  . CYST REMOVAL HAND       Review of Systems  Constitutional: Negative for appetite change, chills, diaphoresis, fatigue, fever and unexpected weight change.  Eyes:       Negative for acute change in vision  Respiratory: Negative for chest tightness, shortness of breath and wheezing.   Cardiovascular: Negative for chest pain.  Gastrointestinal: Negative for diarrhea, nausea and vomiting.  Genitourinary: Negative for dysuria, pelvic pain and vaginal discharge.  Musculoskeletal: Negative for neck pain and neck stiffness.  Skin: Negative for rash.  Neurological: Negative for seizures, syncope, weakness and headaches.  Hematological: Negative for adenopathy. Does not bruise/bleed easily.  Psychiatric/Behavioral: Negative for hallucinations.      Objective:    BP (!) 98/59   Pulse 97   Temp 98.4 F (36.9 C)   Ht 5\' 6"  (1.676 m)   Wt 117 lb (53.1 kg)   BMI 18.88 kg/m  Nursing note and vital signs reviewed.  Physical Exam  Constitutional: She is oriented to person, place, and time. She appears well-developed. No distress.  HENT:  Mouth/Throat: Oropharynx is clear and moist.  Eyes: Conjunctivae are normal.  Neck: Neck supple.  Cardiovascular: Normal rate, regular rhythm, normal heart sounds and intact distal pulses. Exam reveals no gallop and no friction rub.  No murmur heard. Pulmonary/Chest: Effort normal and breath sounds normal. No respiratory distress. She has no wheezes. She has no rales. She exhibits no tenderness.  Abdominal: Soft. Bowel sounds are normal. There is no tenderness.  Lymphadenopathy:    She has no cervical adenopathy.  Neurological: She is alert and oriented to person, place, and time.  Skin: Skin is warm and dry. No rash noted.  Psychiatric: She  has a normal mood and affect. Her behavior is normal. Judgment and thought content normal.       Assessment & Plan:   Problem List Items Addressed This Visit      Other   AIDS (acquired immune deficiency syndrome) (HCC) - Primary    Monica Henson appears to be doing very well with her newly diagnosed HIV/AIDS disease. She is adherent to the Biktarvy and Bactrim with no adverse side effects. She has no current symptoms of opportunistic infection through history or physical exam. She declines vaccinations. We will check her CD4 count and viral load. Continue current dosage of Biktarvy and Bactrim. Follow up office visit in 1 month or sooner if needed.       Relevant Medications   sulfamethoxazole-trimethoprim (BACTRIM) 400-80 MG tablet   bictegravir-emtricitabine-tenofovir AF (BIKTARVY) 50-200-25 MG TABS tablet   Other Relevant Orders   HIV 1 RNA quant-no reflex-bld   T-helper cell (CD4)- ([facility] clinic only)       I am having Estill Dooms maintain her pantoprazole, feeding supplement (PRO-STAT SUGAR FREE 64), promethazine, ferrous sulfate, vitamin B-12, sulfamethoxazole-trimethoprim, and bictegravir-emtricitabine-tenofovir AF.   Meds ordered this encounter  Medications  . sulfamethoxazole-trimethoprim (BACTRIM) 400-80 MG tablet    Sig: Take 1 tablet by mouth daily.    Dispense:  30 tablet    Refill:  2    Order Specific Question:   Supervising Provider    Answer:   Judyann Munson [[id number]]  . bictegravir-emtricitabine-tenofovir AF (BIKTARVY) 50-200-25 MG TABS tablet    Sig: Take 1 tablet by mouth daily.    Dispense:  30 tablet    Refill:  2    Order Specific Question:   Supervising Provider    Answer:   Judyann Munson [[id number]]     Follow-up: Return in about 1 month (around 07/23/2018), or if symptoms worsen or fail to improve.   Marcos Eke, MSN, FNP-C Nurse Practitioner [facility] for Infectious Disease [facility] Office phone: [phone] Pager:  [phone] [facility] Main number: [phone]

## 2018-06-24 ENCOUNTER — Encounter: Payer: Self-pay | Admitting: Family

## 2018-06-24 NOTE — Assessment & Plan Note (Signed)
Monica Henson appears to be doing very well with her newly diagnosed HIV/AIDS disease. She is adherent to the Biktarvy and Bactrim with no adverse side effects. She has no current symptoms of opportunistic infection through history or physical exam. She declines vaccinations. We will check her CD4 count and viral load. Continue current dosage of Biktarvy and Bactrim. Follow up office visit in 1 month or sooner if needed.

## 2018-06-25 LAB — T-HELPER CELL (CD4) - (RCID CLINIC ONLY)
CD4 T CELL HELPER: 26 % — AB (ref 33–55)
CD4 T Cell Abs: 620 /uL (ref 400–2700)

## 2018-06-25 LAB — HIV-1 RNA QUANT-NO REFLEX-BLD
HIV 1 RNA QUANT: 102 {copies}/mL — AB
HIV-1 RNA Quant, Log: 2.01 Log copies/mL — ABNORMAL HIGH

## 2018-07-05 MED FILL — BIKTARVY 50-200-25 MG TABS: 50-200-25 | 30 days supply | Qty: 30 | Fill #2

## 2018-07-22 ENCOUNTER — Ambulatory Visit (INDEPENDENT_AMBULATORY_CARE_PROVIDER_SITE_OTHER): Payer: Medicaid Other | Admitting: Family

## 2018-07-22 ENCOUNTER — Encounter: Payer: Self-pay | Admitting: Family

## 2018-07-22 VITALS — BP 104/69 | HR 106 | Temp 98.3°F | Wt 130.0 lb

## 2018-07-22 DIAGNOSIS — Z23 Encounter for immunization: Secondary | ICD-10-CM | POA: Diagnosis not present

## 2018-07-22 DIAGNOSIS — B20 Human immunodeficiency virus [HIV] disease: Secondary | ICD-10-CM

## 2018-07-22 MED ORDER — BICTEGRAVIR-EMTRICITAB-TENOFOV 50-200-25 MG PO TABS: 1.0000 | ORAL_TABLET | Freq: Every day | ORAL | 2 refills | Status: DC

## 2018-07-22 NOTE — Patient Instructions (Signed)
Nice to see you.  Please STOP taking the BACTRIM.  Continue to take your Biktarvy.  We will check your blood work today.  Plan for follow up office visit in 3 months or sooner if needed with blood work 1-2 weeks prior to your appointment.

## 2018-07-22 NOTE — Assessment & Plan Note (Signed)
Ms. Siegmann has been taking her Bactrim and Biktarvy as prescribed with no adverse side effects or missed doses.  She does have a mild headache that she noted started with her menstrual cycle and is concerned that it may be related to [facility] which is possible although unlikely.  She has no signs/symptoms of opportunistic infection through history or physical exam.  Prevnar and influenza updated today.  Continue current dose of Biktarvy.  We will check her CD4 count today. CD4 has been above 200 for 3+ months and will discontinue Bactrim. Follow up office visit in 3 months or sooner with blood work 1-2 weeks prior to appointment.

## 2018-07-22 NOTE — Progress Notes (Signed)
Subjective:    Patient ID: Monica Henson, female    DOB: May 29, 1996, 22 y.o.   MRN: [id number]  Chief Complaint  Patient presents with  . HIV Positive/AIDS     HPI:  Monica Henson is a 22 y.o. female who presents today for a follow up office visit.   Monica Henson was last seen in the office on 06/23/2018 for routine follow-up and was found to have improving HIV disease with a viral load of 100.  She was continued on Biktarvy.  She is due for Prevnar, Pneumovax, and Menveo, and influenza.  Monica Henson has been taking her Bactrim and Biktarvy as prescribed with no adverse side effects or missed doses.  She has no problems obtaining her medication and is currently covered through [facility] and receives her medication from [facility]. She does have a headache today that started with her menstrual cycle and has concerns it may be related to her Biktarvy. 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    No Known Allergies    Outpatient Medications Prior to Visit  Medication Sig Dispense Refill  . bictegravir-emtricitabine-tenofovir AF (BIKTARVY) 50-200-25 MG TABS tablet Take 1 tablet by mouth daily. 30 tablet 2  . Amino Acids-Protein Hydrolys (FEEDING SUPPLEMENT, PRO-STAT SUGAR FREE 64,) LIQD Take 30 mLs by mouth 2 (two) times daily. (Patient not taking: Reported on 07/22/2018) 900 mL 0  . ferrous sulfate 325 (65 FE) MG tablet Take 1 tablet (325 mg total) by mouth daily. (Patient not taking: Reported on 07/22/2018) 30 tablet 0  . vitamin B-12 (CYANOCOBALAMIN) 1000 MCG tablet Take 2 tablets (2,000 mcg total) by mouth daily. (Patient not taking: Reported on 07/22/2018) 60 tablet 0  . pantoprazole (PROTONIX) 40 MG tablet Take 1 tablet (40 mg total) by mouth daily. (Patient not taking: Reported on 07/22/2018) 30 tablet 0  . promethazine (PHENERGAN) 12.5 MG tablet Take 1 tablet (12.5 mg total) by mouth every 6 (six) hours as needed for nausea or vomiting.  (Patient not taking: Reported on 07/22/2018) 30 tablet 0  . sulfamethoxazole-trimethoprim (BACTRIM) 400-80 MG tablet Take 1 tablet by mouth daily. (Patient not taking: Reported on 07/22/2018) 30 tablet 2   No facility-administered medications prior to visit.      Past Medical History:  Diagnosis Date  . Anemia   . Elevated LFTs   . HIV infection [facility])      Past Surgical History:  Procedure Laterality Date  . CYST REMOVAL HAND         Review of Systems  Constitutional: Negative for appetite change, chills, diaphoresis, fatigue, fever and unexpected weight change.  Eyes:       Negative for acute change in vision  Respiratory: Negative for cough, chest tightness, shortness of breath and wheezing.   Cardiovascular: Negative for chest pain and leg swelling.  Gastrointestinal: Negative for abdominal distention, constipation, diarrhea, nausea and vomiting.  Genitourinary: Negative for dysuria, pelvic pain and vaginal discharge.  Musculoskeletal: Negative for neck pain and neck stiffness.  Skin: Negative for rash.  Neurological: Positive for headaches. Negative for dizziness, seizures, syncope, weakness and light-headedness.  Hematological: Negative for adenopathy. Does not bruise/bleed easily.  Psychiatric/Behavioral: Negative for hallucinations.      Objective:    BP 104/69   Pulse (!) 106   Temp 98.3 F (36.8 C)   Wt 130 lb (59 kg)   LMP 07/15/2018 (Approximate)   BMI 20.98 kg/m  Nursing note and vital  signs reviewed.  Physical Exam  Constitutional: She is oriented to person, place, and time. She appears well-developed. No distress.  HENT:  Mouth/Throat: Oropharynx is clear and moist.  Eyes: Conjunctivae are normal.  Neck: Neck supple.  Cardiovascular: Normal rate, regular rhythm, normal heart sounds and intact distal pulses. Exam reveals no gallop and no friction rub.  No murmur heard. Pulmonary/Chest: Effort normal and breath sounds normal. No respiratory  distress. She has no wheezes. She has no rales. She exhibits no tenderness.  Abdominal: Soft. Bowel sounds are normal. There is no tenderness.  Lymphadenopathy:    She has no cervical adenopathy.  Neurological: She is alert and oriented to person, place, and time.  Skin: Skin is warm and dry. No rash noted.  Psychiatric: She has a normal mood and affect. Her behavior is normal. Judgment and thought content normal.       Assessment & Plan:   Problem List Items Addressed This Visit      Other   HIV disease (HCC) - Primary    Monica Henson has been taking her Bactrim and Biktarvy as prescribed with no adverse side effects or missed doses.  She does have a mild headache that she noted started with her menstrual cycle and is concerned that it may be related to [facility] which is possible although unlikely.  She has no signs/symptoms of opportunistic infection through history or physical exam.  Prevnar and influenza updated today.  Continue current dose of Biktarvy.  We will check her CD4 count today. CD4 has been above 200 for 3+ months and will discontinue Bactrim. Follow up office visit in 3 months or sooner with blood work 1-2 weeks prior to appointment.       Relevant Medications   bictegravir-emtricitabine-tenofovir AF (BIKTARVY) 50-200-25 MG TABS tablet   Other Relevant Orders   T-helper cell (CD4)- ([facility] clinic only)   HIV-1 RNA quant-no reflex-bld   CBC   RPR   T-helper cell (CD4)- ([facility] clinic only)   HIV 1 RNA quant-no reflex-bld    Other Visit Diagnoses    AIDS (acquired immune deficiency syndrome) (HCC)       Relevant Medications   bictegravir-emtricitabine-tenofovir AF (BIKTARVY) 50-200-25 MG TABS tablet       I have discontinued Monica Henson's pantoprazole, promethazine, and sulfamethoxazole-trimethoprim. I am also having her maintain her feeding supplement (PRO-STAT SUGAR FREE 64), ferrous sulfate, vitamin B-12, and bictegravir-emtricitabine-tenofovir AF.   Meds ordered  this encounter  Medications  . bictegravir-emtricitabine-tenofovir AF (BIKTARVY) 50-200-25 MG TABS tablet    Sig: Take 1 tablet by mouth daily.    Dispense:  30 tablet    Refill:  2    Order Specific Question:   Supervising Provider    Answer:   Judyann Munson [[id number]]     Follow-up: Return in about 3 months (around 10/22/2018), or if symptoms worsen or fail to improve.   Marcos Eke, MSN, FNP-C Nurse Practitioner [facility] for Infectious Disease [facility] Office phone: [phone] Pager: [phone] [facility] Main number: [phone]

## 2018-07-23 LAB — T-HELPER CELL (CD4) - (RCID CLINIC ONLY)
CD4 % Helper T Cell: 20 % — ABNORMAL LOW (ref 33–55)
CD4 T Cell Abs: 430 /uL (ref 400–2700)

## 2018-07-24 LAB — HIV-1 RNA QUANT-NO REFLEX-BLD
HIV 1 RNA QUANT: 95 {copies}/mL — AB
HIV-1 RNA QUANT, LOG: 1.98 {Log_copies}/mL — AB

## 2018-08-02 MED FILL — BIKTARVY 50-200-25 MG TABS: 50-200-25 | 30 days supply | Qty: 30 | Fill #0

## 2018-09-06 MED FILL — BIKTARVY 50-200-25 MG TABS: 50-200-25 | 30 days supply | Qty: 30 | Fill #1

## 2018-10-04 MED FILL — BIKTARVY 50-200-25 MG TABS: 50-200-25 | 30 days supply | Qty: 30 | Fill #2

## 2018-10-06 ENCOUNTER — Other Ambulatory Visit: Payer: Self-pay

## 2018-10-06 DIAGNOSIS — Z113 Encounter for screening for infections with a predominantly sexual mode of transmission: Secondary | ICD-10-CM

## 2018-10-06 DIAGNOSIS — Z79899 Other long term (current) drug therapy: Secondary | ICD-10-CM

## 2018-10-06 DIAGNOSIS — B20 Human immunodeficiency virus [HIV] disease: Secondary | ICD-10-CM

## 2018-10-07 ENCOUNTER — Other Ambulatory Visit: Payer: Self-pay

## 2018-10-07 DIAGNOSIS — Z113 Encounter for screening for infections with a predominantly sexual mode of transmission: Secondary | ICD-10-CM

## 2018-10-07 DIAGNOSIS — B20 Human immunodeficiency virus [HIV] disease: Secondary | ICD-10-CM

## 2018-10-08 LAB — URINE CYTOLOGY ANCILLARY ONLY
CHLAMYDIA, DNA PROBE: NEGATIVE
Neisseria Gonorrhea: NEGATIVE

## 2018-10-08 LAB — T-HELPER CELL (CD4) - (RCID CLINIC ONLY)
CD4 % Helper T Cell: 24 % — ABNORMAL LOW (ref 33–55)
CD4 T Cell Abs: 740 /uL (ref 400–2700)

## 2018-10-09 LAB — CBC
HEMATOCRIT: 36.9 % (ref 35.0–45.0)
Hemoglobin: 12.3 g/dL (ref 11.7–15.5)
MCH: 29 pg (ref 27.0–33.0)
MCHC: 33.3 g/dL (ref 32.0–36.0)
MCV: 87 fL (ref 80.0–100.0)
MPV: 10.8 fL (ref 7.5–12.5)
PLATELETS: 299 10*3/uL (ref 140–400)
RBC: 4.24 10*6/uL (ref 3.80–5.10)
RDW: 11.9 % (ref 11.0–15.0)
WBC: 8.9 10*3/uL (ref 3.8–10.8)

## 2018-10-09 LAB — COMPREHENSIVE METABOLIC PANEL
AG RATIO: 1.5 (calc) (ref 1.0–2.5)
ALBUMIN MSPROF: 4.8 g/dL (ref 3.6–5.1)
ALKALINE PHOSPHATASE (APISO): 53 U/L (ref 33–115)
ALT: 11 U/L (ref 6–29)
AST: 16 U/L (ref 10–30)
BILIRUBIN TOTAL: 0.6 mg/dL (ref 0.2–1.2)
BUN: 12 mg/dL (ref 7–25)
CALCIUM: 9.8 mg/dL (ref 8.6–10.2)
CO2: 23 mmol/L (ref 20–32)
Chloride: 107 mmol/L (ref 98–110)
Creat: 0.83 mg/dL (ref 0.50–1.10)
GLOBULIN: 3.3 g/dL (ref 1.9–3.7)
Glucose, Bld: 86 mg/dL (ref 65–99)
POTASSIUM: 3.7 mmol/L (ref 3.5–5.3)
SODIUM: 138 mmol/L (ref 135–146)
TOTAL PROTEIN: 8.1 g/dL (ref 6.1–8.1)

## 2018-10-09 LAB — HIV-1 RNA QUANT-NO REFLEX-BLD
HIV 1 RNA Quant: 161 copies/mL — ABNORMAL HIGH
HIV-1 RNA QUANT, LOG: 2.21 {Log_copies}/mL — AB

## 2018-10-09 LAB — RPR: RPR: NONREACTIVE

## 2018-10-21 ENCOUNTER — Ambulatory Visit (INDEPENDENT_AMBULATORY_CARE_PROVIDER_SITE_OTHER): Payer: BLUE CROSS/BLUE SHIELD | Admitting: Family

## 2018-10-21 ENCOUNTER — Encounter: Payer: Self-pay | Admitting: Family

## 2018-10-21 VITALS — BP 118/80 | HR 82 | Temp 98.8°F | Wt 128.0 lb

## 2018-10-21 DIAGNOSIS — Z79899 Other long term (current) drug therapy: Secondary | ICD-10-CM

## 2018-10-21 DIAGNOSIS — B20 Human immunodeficiency virus [HIV] disease: Secondary | ICD-10-CM | POA: Diagnosis not present

## 2018-10-21 DIAGNOSIS — N926 Irregular menstruation, unspecified: Secondary | ICD-10-CM | POA: Diagnosis not present

## 2018-10-21 DIAGNOSIS — Z87891 Personal history of nicotine dependence: Secondary | ICD-10-CM

## 2018-10-21 MED ORDER — ABACAVIR-DOLUTEGRAVIR-LAMIVUD 600-50-300 MG PO TABS: 1.0000 | ORAL_TABLET | Freq: Every day | ORAL | 9 refills | Status: DC

## 2018-10-21 NOTE — Assessment & Plan Note (Signed)
Monica Henson continues to have well controlled HIV disease with good adherence and tolerance of Biktarvy. No signs/symptoms of opportunistic infection or progressive HIV disease. Has had 2 positive pregnancy tests and will plan to change medication from [place] to Triumeq. Pharmacy staff is working with new insurance to get the medication covered. Discussed progression of pregnancy and HIV. Will check viral load today. Continue current dose of Biktarvy until Triumeq is available. Follow up in 1 month or sooner if needed.

## 2018-10-21 NOTE — Assessment & Plan Note (Signed)
Missed most recent menstrual cycle with positive home pregnancy test. Will check serum HcG. Recommended to start prenatal vitamin opposite current HIV medications. Will establish with OB.

## 2018-10-21 NOTE — Progress Notes (Signed)
Subjective:    Patient ID: Monica Henson, female    DOB: 09/30/1996, 23 y.o.   MRN: [id number]  Chief Complaint  Patient presents with  . Follow-up    pos pregnancy test/ new insurance      HPI:  Monica Henson is a 23 y.o. female who presents today for routine follow up of HIV disease.  [name] was last seen on 07/22/18 for routine follow up with good adherence and tolerance of her regimen of Biktarvy. CD4 count at the time was 430 with a viral load of 95. Most recent blood work completed on 10/07/18 with a viral load of 161 and CD4 count of 740. Health maintenance due includes Pneumovax, Menveo and dental screen.   [name] continues to take her Biktarvy as prescribed with no missed does nor adverse side effects. She has new insurance and has concerns about her medications being covered. She also missed her last menstrual cycle and has had 2 positive pregnancy tests at home.  Denies fevers, chills, night sweats, headaches, changes in vision, neck pain/stiffness, nausea, diarrhea, vomiting, lesions or rashes.  No Known Allergies    Outpatient Medications Prior to Visit  Medication Sig Dispense Refill  . ferrous sulfate 325 (65 FE) MG tablet Take 1 tablet (325 mg total) by mouth daily. 30 tablet 0  . vitamin B-12 (CYANOCOBALAMIN) 1000 MCG tablet Take 2 tablets (2,000 mcg total) by mouth daily. 60 tablet 0  . bictegravir-emtricitabine-tenofovir AF (BIKTARVY) 50-200-25 MG TABS tablet Take 1 tablet by mouth daily. 30 tablet 2  . Amino Acids-Protein Hydrolys (FEEDING SUPPLEMENT, PRO-STAT SUGAR FREE 64,) LIQD Take 30 mLs by mouth 2 (two) times daily. (Patient not taking: Reported on 07/22/2018) 900 mL 0   No facility-administered medications prior to visit.      Past Medical History:  Diagnosis Date  . Anemia   . Elevated LFTs   . HIV infection [facility])      Past Surgical History:  Procedure Laterality Date  . CYST REMOVAL HAND         Review of Systems    Constitutional: Negative for appetite change, chills, diaphoresis, fatigue, fever and unexpected weight change.  Eyes:       Negative for acute change in vision  Respiratory: Negative for chest tightness, shortness of breath and wheezing.   Cardiovascular: Negative for chest pain.  Gastrointestinal: Negative for diarrhea, nausea and vomiting.  Genitourinary: Negative for dysuria, pelvic pain and vaginal discharge.  Musculoskeletal: Negative for neck pain and neck stiffness.  Skin: Negative for rash.  Neurological: Negative for seizures, syncope, weakness and headaches.  Hematological: Negative for adenopathy. Does not bruise/bleed easily.  Psychiatric/Behavioral: Negative for hallucinations.      Objective:    BP 118/80   Pulse 82   Temp 98.8 F (37.1 C) (Oral)   Wt 128 lb (58.1 kg)   LMP 09/12/2018   BMI 20.66 kg/m  Nursing note and vital signs reviewed.  Physical Exam Constitutional:      General: She is not in acute distress.    Appearance: She is well-developed.  Eyes:     Conjunctiva/sclera: Conjunctivae normal.  Neck:     Musculoskeletal: Neck supple.  Cardiovascular:     Rate and Rhythm: Normal rate and regular rhythm.     Heart sounds: Normal heart sounds. No murmur. No friction rub. No gallop.   Pulmonary:     Effort: Pulmonary effort is normal. No respiratory distress.     Breath sounds: Normal breath  sounds. No wheezing or rales.  Chest:     Chest wall: No tenderness.  Abdominal:     General: Bowel sounds are normal.     Palpations: Abdomen is soft.     Tenderness: There is no abdominal tenderness.  Lymphadenopathy:     Cervical: No cervical adenopathy.  Skin:    General: Skin is warm and dry.     Findings: No rash.  Neurological:     Mental Status: She is alert.  Psychiatric:        Mood and Affect: Mood is anxious.        Thought Content: Thought content normal.        Assessment & Plan:   Problem List Items Addressed This Visit       Other   HIV disease (HCC) - Primary    Ms. Prout continues to have well controlled HIV disease with good adherence and tolerance of Biktarvy. No signs/symptoms of opportunistic infection or progressive HIV disease. Has had 2 positive pregnancy tests and will plan to change medication from [place] to Triumeq. Pharmacy staff is working with new insurance to get the medication covered. Discussed progression of pregnancy and HIV. Will check viral load today. Continue current dose of Biktarvy until Triumeq is available. Follow up in 1 month or sooner if needed.       Relevant Medications   abacavir-dolutegravir-lamiVUDine (TRIUMEQ) 600-50-300 MG tablet   Other Relevant Orders   HIV-1 RNA quant-no reflex-bld   Missed menses    Missed most recent menstrual cycle with positive home pregnancy test. Will check serum HcG. Recommended to start prenatal vitamin opposite current HIV medications. Will establish with OB.      Relevant Orders   hCG, serum, qualitative       I have discontinued [name]'s feeding supplement (PRO-STAT SUGAR FREE 64) and bictegravir-emtricitabine-tenofovir AF. I am also having her start on abacavir-dolutegravir-lamiVUDine. Additionally, I am having her maintain her ferrous sulfate and vitamin B-12.   Meds ordered this encounter  Medications  . abacavir-dolutegravir-lamiVUDine (TRIUMEQ) 600-50-300 MG tablet    Sig: Take 1 tablet by mouth daily.    Dispense:  30 tablet    Refill:  9     Follow-up: Return in about 1 month (around 11/21/2018), or if symptoms worsen or fail to improve.   Marcos Eke, MSN, FNP-C Nurse Practitioner [facility] for Infectious Disease [facility] Office phone: [phone] Pager: [phone] [facility] Main number: [phone]

## 2018-10-21 NOTE — Progress Notes (Signed)
HPI: Monica Henson is a 23 y.o. female who presents to the [facility] for HIV follow-up.  Patient Active Problem List   Diagnosis Date Noted  . Missed menses 10/21/2018  . Acute calculous cholecystitis 05/14/2018  . Anemia, unspecified 05/13/2018  . Vomiting 05/13/2018  . Anorexia 05/13/2018  . Weight loss 05/13/2018  . Elevated LFTs 05/13/2018  . Malnutrition of moderate degree 05/13/2018  . HIV disease (HCC) 05/07/2018    Patient's Medications  New Prescriptions   ABACAVIR-DOLUTEGRAVIR-LAMIVUDINE (TRIUMEQ) 600-50-300 MG TABLET    Take 1 tablet by mouth daily.  Previous Medications   FERROUS SULFATE 325 (65 FE) MG TABLET    Take 1 tablet (325 mg total) by mouth daily.   VITAMIN B-12 (CYANOCOBALAMIN) 1000 MCG TABLET    Take 2 tablets (2,000 mcg total) by mouth daily.  Modified Medications   No medications on file  Discontinued Medications   AMINO ACIDS-PROTEIN HYDROLYS (FEEDING SUPPLEMENT, PRO-STAT SUGAR FREE 64,) LIQD    Take 30 mLs by mouth 2 (two) times daily.   BICTEGRAVIR-EMTRICITABINE-TENOFOVIR AF (BIKTARVY) 50-200-25 MG TABS TABLET    Take 1 tablet by mouth daily.    Allergies: No Known Allergies  Past Medical History: Past Medical History:  Diagnosis Date  . Anemia   . Elevated LFTs   . HIV infection [facility])     Social History: Social History   Socioeconomic History  . Marital status: Single    Spouse name: Not on file  . Number of children: Not on file  . Years of education: Not on file  . Highest education level: Not on file  Occupational History  . Occupation: Network engineer   Social Needs  . Financial resource strain: Not on file  . Food insecurity:    Worry: Not on file    Inability: Not on file  . Transportation needs:    Medical: Not on file    Non-medical: Not on file  Tobacco Use  . Smoking status: Former Smoker    Packs/day: 0.50    Types: Cigarettes  . Smokeless tobacco: Never Used  . Tobacco comment: As a teenage for a couple of  months   Substance and Sexual Activity  . Alcohol use: No    Frequency: Never  . Drug use: No  . Sexual activity: Not on file    Comment: declined condoms  Lifestyle  . Physical activity:    Days per week: Not on file    Minutes per session: Not on file  . Stress: Not on file  Relationships  . Social connections:    Talks on phone: Not on file    Gets together: Not on file    Attends religious service: Not on file    Active member of club or organization: Not on file    Attends meetings of clubs or organizations: Not on file    Relationship status: Not on file  Other Topics Concern  . Not on file  Social History Narrative  . Not on file    Labs: Lab Results  Component Value Date   HIV1RNAQUANT 161 (H) 10/07/2018   HIV1RNAQUANT 95 (H) 07/22/2018   HIV1RNAQUANT 102 (H) 06/23/2018   CD4TABS 740 10/07/2018   CD4TABS 430 07/22/2018   CD4TABS 620 06/23/2018    RPR and STI Lab Results  Component Value Date   LABRPR NON-REACTIVE 10/07/2018   LABRPR Non Reactive 05/13/2018    STI Results GC CT  10/07/2018 Negative Negative    Hepatitis B Lab  Results  Component Value Date   HEPBSAG Negative 05/15/2018   Hepatitis C No results found for: HEPCAB, HCVRNAPCRQN Hepatitis A No results found for: HAV Lipids: Lab Results  Component Value Date   CHOL 89 05/13/2018   TRIG 159 (H) 05/13/2018   HDL <10 (L) 05/13/2018   CHOLHDL NOT CALCULATED 05/13/2018   VLDL 32 05/13/2018   LDLCALC NOT CALCULATED 05/13/2018    Current HIV Regimen: Biktarvy  Assessment: Monica Henson is here today to follow-up with Monica Henson for her HIV infection.  She is currently taking Biktarvy but just found out she was pregnant so will need to be changed to a different medication. She just got new insurance and is worried that it will not be covered.  It initially looked as if no HIV medication would be approved under her insurance, but the prescription just needed to be sent to [facility] (she is insured  through [place]). Monica Henson was able to figure everything out after calling her insurance.  She came and got a Triumeq co-pay card and will go to pick it up at [facility].  Plan: - Discontinue Biktarvy - Start Triumeq PO once daily  Monica Henson, PharmD, BCIDP, AAHIVP, CPP Infectious Diseases Clinical Pharmacist [facility] 10/21/2018, 4:45 PM

## 2018-10-21 NOTE — Patient Instructions (Signed)
Good to see you!  We will check your blood work today.  Congratulations!  We will continue to work on getting medication changed.  Start taking a pre-natal vitamin of your choice. Please take it opposite your Biktarvy.  Plan to follow up in 1 month or sooner if needed.

## 2018-10-25 ENCOUNTER — Telehealth: Payer: Self-pay | Admitting: *Deleted

## 2018-10-25 NOTE — Telephone Encounter (Signed)
Patient returning Greg's call regarding lab results.  RN confirmed patient's positive serum HCG to match her 2 home pregnancy tests. She has a Arboriculturist in [place], is scheduled to see them 1/16. She will contact us with that provider's name.  She reports light spotting, 12/31 - 1/3. Please advise on Triumeq with early pregnancy. Andree Coss, RN

## 2018-10-26 LAB — HIV-1 RNA QUANT-NO REFLEX-BLD
HIV 1 RNA Quant: 32 copies/mL — ABNORMAL HIGH
HIV-1 RNA Quant, Log: 1.51 Log copies/mL — ABNORMAL HIGH

## 2018-10-26 LAB — HCG, SERUM, QUALITATIVE: PREG SERUM: POSITIVE — AB

## 2018-10-27 NOTE — Telephone Encounter (Signed)
Thanks

## 2018-10-27 NOTE — Telephone Encounter (Signed)
We switched her to Triumeq at her most recent office visit for her pregnancy. Thank you for the update.

## 2018-11-19 ENCOUNTER — Encounter: Payer: Self-pay | Admitting: Family

## 2018-11-19 ENCOUNTER — Ambulatory Visit (INDEPENDENT_AMBULATORY_CARE_PROVIDER_SITE_OTHER): Payer: BLUE CROSS/BLUE SHIELD | Admitting: Family

## 2018-11-19 VITALS — BP 99/64 | HR 73 | Temp 98.2°F | Ht 66.0 in | Wt 130.5 lb

## 2018-11-19 DIAGNOSIS — O98711 Human immunodeficiency virus [HIV] disease complicating pregnancy, first trimester: Secondary | ICD-10-CM | POA: Diagnosis not present

## 2018-11-19 DIAGNOSIS — B20 Human immunodeficiency virus [HIV] disease: Secondary | ICD-10-CM | POA: Diagnosis not present

## 2018-11-19 DIAGNOSIS — Z79899 Other long term (current) drug therapy: Secondary | ICD-10-CM

## 2018-11-19 DIAGNOSIS — Z3A00 Weeks of gestation of pregnancy not specified: Secondary | ICD-10-CM

## 2018-11-19 DIAGNOSIS — O98719 Human immunodeficiency virus [HIV] disease complicating pregnancy, unspecified trimester: Secondary | ICD-10-CM | POA: Insufficient documentation

## 2018-11-19 NOTE — Patient Instructions (Signed)
Good to see you.  Continue to take your Triumeq as prescribed.  Continue to take your pre-natal vitamin.   If you need assistance finding and OB, please let us know.  Follow up in 3 months or sooner if needed.

## 2018-11-19 NOTE — Progress Notes (Signed)
Subjective:    Patient ID: Monica Henson, female    DOB: September 22, 1996, 23 y.o.   MRN: [id number]  Chief Complaint  Patient presents with  . HIV Positive/AIDS     HPI:  Monica Henson is a 23 y.o. female who presents today for a follow up office visit.  Monica Henson was last seen in the office on 10/21/18 for routine follow up of HIV disease and found to have a positive home pregnancy test. Medication was changed from [place] to [place]. Her viral load remain suppressed with a CD4 count of 740.  Monica Henson has been taking her Triumeq as prescribed with no adverse side effects or missed doses. She has no problems obtaining her medication. Recently seen by OB and had a very poor experience and seeking a new OB in the [place] area.   Denies fevers, chills, night sweats, headaches, changes in vision, neck pain/stiffness, nausea, diarrhea, vomiting, lesions or rashes.   No Known Allergies    Outpatient Medications Prior to Visit  Medication Sig Dispense Refill  . abacavir-dolutegravir-lamiVUDine (TRIUMEQ) 600-50-300 MG tablet Take 1 tablet by mouth daily. 30 tablet 9  . ferrous sulfate 325 (65 FE) MG tablet Take 1 tablet (325 mg total) by mouth daily. 30 tablet 0  . Prenatal MV & Min w/FA-DHA (PRENATAL ADULT GUMMY/DHA/FA) 0.4-25 MG CHEW Chew by mouth.    . vitamin B-12 (CYANOCOBALAMIN) 1000 MCG tablet Take 2 tablets (2,000 mcg total) by mouth daily. 60 tablet 0   No facility-administered medications prior to visit.      Past Medical History:  Diagnosis Date  . Acute calculous cholecystitis 05/14/2018  . Anemia   . Anorexia 05/13/2018  . Elevated LFTs   . HIV infection (HCC)   . Malnutrition of moderate degree 05/13/2018     Past Surgical History:  Procedure Laterality Date  . CYST REMOVAL HAND         Review of Systems  Constitutional: Negative for appetite change, chills, diaphoresis, fatigue, fever and unexpected weight change.  Eyes:       Negative for acute change in  vision  Respiratory: Negative for chest tightness, shortness of breath and wheezing.   Cardiovascular: Negative for chest pain.  Gastrointestinal: Negative for diarrhea, nausea and vomiting.  Genitourinary: Negative for dysuria, pelvic pain and vaginal discharge.  Musculoskeletal: Negative for neck pain and neck stiffness.  Skin: Negative for rash.  Neurological: Negative for seizures, syncope, weakness and headaches.  Hematological: Negative for adenopathy. Does not bruise/bleed easily.  Psychiatric/Behavioral: Negative for hallucinations.      Objective:    BP 99/64   Pulse 73   Temp 98.2 F (36.8 C) (Oral)   Ht 5\' 6"  (1.676 m)   Wt 130 lb 8 oz (59.2 kg)   LMP 09/12/2018   BMI 21.06 kg/m  Nursing note and vital signs reviewed.  Physical Exam Constitutional:      General: She is not in acute distress.    Appearance: She is well-developed.  Eyes:     Conjunctiva/sclera: Conjunctivae normal.  Neck:     Musculoskeletal: Neck supple.  Cardiovascular:     Rate and Rhythm: Normal rate and regular rhythm.     Heart sounds: Normal heart sounds. No murmur. No friction rub. No gallop.   Pulmonary:     Effort: Pulmonary effort is normal. No respiratory distress.     Breath sounds: Normal breath sounds. No wheezing or rales.  Chest:     Chest wall: No tenderness.  Abdominal:     General: Bowel sounds are normal.     Palpations: Abdomen is soft.     Tenderness: There is no abdominal tenderness.  Lymphadenopathy:     Cervical: No cervical adenopathy.  Skin:    General: Skin is warm and dry.     Findings: No rash.  Neurological:     Mental Status: She is alert and oriented to person, place, and time.  Psychiatric:        Behavior: Behavior normal.        Thought Content: Thought content normal.        Judgment: Judgment normal.        Assessment & Plan:   Problem List Items Addressed This Visit      Other   HIV disease (HCC)   HIV disease affecting pregnancy in  first trimester - Primary    Monica Henson appears to be doing very well with the medication change from [place] to Triumeq with good adherence and tolerance. She continues to take her prenatal vitamin and is eating and drinking well. No signs/symptoms of opportunistic infection or progressive HIV disease. Will re-check viral load today. Continue current dose of Triumeq. Advised to let us know if she needs assistance finding an OB. Follow up in 3 months or sooner if needed.       Relevant Orders   HIV-1 RNA quant-no reflex-bld   T-helper cell (CD4)- ([facility] clinic only)   HIV-1 RNA quant-no reflex-bld   Comprehensive metabolic panel       I am having Estill Dooms maintain her ferrous sulfate, vitamin B-12, abacavir-dolutegravir-lamiVUDine, and Prenatal Adult Gummy/DHA/FA.    Follow-up: Return in about 3 months (around 02/17/2019), or if symptoms worsen or fail to improve.   Marcos Eke, MSN, FNP-C Nurse Practitioner [facility] Office phone: [phone] Pager: [phone] [facility] Main number: [phone]

## 2018-11-19 NOTE — Assessment & Plan Note (Signed)
Monica Henson appears to be doing very well with the medication change from [facility] to Triumeq with good adherence and tolerance. She continues to take her prenatal vitamin and is eating and drinking well. No signs/symptoms of opportunistic infection or progressive HIV disease. Will re-check viral load today. Continue current dose of Triumeq. Advised to let us know if she needs assistance finding an OB. Follow up in 3 months or sooner if needed.

## 2018-11-22 LAB — HIV-1 RNA QUANT-NO REFLEX-BLD
HIV 1 RNA Quant: 54 copies/mL — ABNORMAL HIGH
HIV-1 RNA Quant, Log: 1.73 Log copies/mL — ABNORMAL HIGH

## 2018-12-02 ENCOUNTER — Encounter: Payer: Self-pay | Admitting: Behavioral Health

## 2019-01-10 IMAGING — NM NM HEPATOBILIARY IMAGE, INC GB
2 series · 12 of 12 positions shown · non-contrast
Comparison: Ultrasound 05/13/2018

CLINICAL DATA: Right upper quadrant pain nausea vomiting lack of
appetite abnormal LFT

EXAM:
NUCLEAR MEDICINE HEPATOBILIARY IMAGING
TECHNIQUE: Sequential images of the abdomen were obtained [DATE] minutes
following intravenous administration of radiopharmaceutical.
RADIOPHARMACEUTICALS:  7.49 mCi Vc-66m Choletec IV. Intravenous
morphine given after first hour of the examination.

[Series 1: biliary · 3.25mm/px · 6 of 60 frames shown]
[frame 6/60]
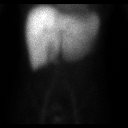
[frame 16/60]
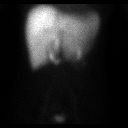
[frame 26/60]
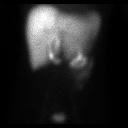
[frame 36/60]
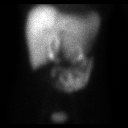
[frame 46/60]
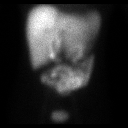
[frame 56/60]
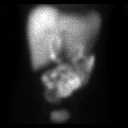

[Series 2: anterior · 3.25mm/px · 6 of 28 frames shown]
[frame 3/28]
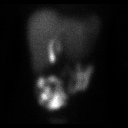
[frame 7/28]
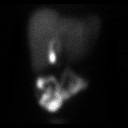
[frame 12/28]
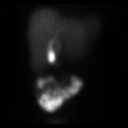
[frame 17/28]
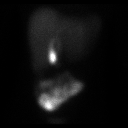
[frame 21/28]
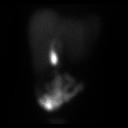
[frame 26/28]
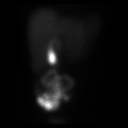

[12 of 12 positions shown; findings below may reference images not displayed]

FINDINGS: Prompt uptake and biliary excretion of activity by the liver is
seen. Gallbladder activity is visualized following morphine
administration, consistent with patency of cystic duct. Biliary
activity passes into small bowel, consistent with patent common bile
duct.
IMPRESSION: 1. Gallbladder is promptly visualized following morphine
augmentation, arguing against acute cholecystitis.
2. Delayed gallbladder visualization can be seen in the setting of
chronic gallbladder disease/chronic cholecystitis.

## 2019-02-10 ENCOUNTER — Encounter: Payer: Self-pay | Admitting: Family

## 2019-02-10 ENCOUNTER — Ambulatory Visit (INDEPENDENT_AMBULATORY_CARE_PROVIDER_SITE_OTHER): Payer: BLUE CROSS/BLUE SHIELD | Admitting: Family

## 2019-02-10 ENCOUNTER — Other Ambulatory Visit: Payer: Self-pay

## 2019-02-10 DIAGNOSIS — B20 Human immunodeficiency virus [HIV] disease: Secondary | ICD-10-CM

## 2019-02-10 DIAGNOSIS — O98712 Human immunodeficiency virus [HIV] disease complicating pregnancy, second trimester: Secondary | ICD-10-CM

## 2019-02-10 NOTE — Patient Instructions (Signed)
Nice to speak with you.  Excited to hear that everything is going well.  Please continue to take your Triumeq as prescribed daily.  We will plan to follow-up in 2 months or sooner if needed with blood work at that appointment.  Have a great day and stay safe.

## 2019-02-10 NOTE — Progress Notes (Signed)
Subjective:    Patient ID: Monica Henson, female    DOB: Jan 13, 1996, 23 y.o.   MRN: [id number]  Chief Complaint  Patient presents with  . HIV Positive/AIDS     Virtual Visit via Telephone Note   I connected with Ms. Monica Henson on 02/10/2019 at 9:45 AM by telephone and verified that I am speaking with the correct person using two identifiers.   I discussed the limitations, risks, security and privacy concerns of performing an evaluation and management service by telephone and the availability of in person appointments. I also discussed with the patient that there may be a patient responsible charge related to this service. The patient expressed understanding and agreed to proceed.   HPI:  Monica Henson is a 23 y.o. female with history of well controlled HIV infection and initially started on Biktarvy until recently when she found out she was pregnant with medication regimen switching to Triumeq. Her last lab work was completed on 11/19/18 with viral load of 54 and CD4 count back in December 2019 of 740. She is currently [redacted] weeks pregnant and followed by [facility] with most recent ultrasound being normal. She has follow up on 02/15/2019.   Monica Henson has continued to take her Triumeq as prescribed with no adverse side effects or missed doses.  Overall feeling well describes her pregnancy is going smoothly currently. Denies fevers, chills, night sweats, headaches, changes in vision, neck pain/stiffness, nausea, diarrhea, vomiting, lesions or rashes.  Neck: Continues to work at [employer] full-time and is covered through Nurse, learning disability through [employer] and has no problems obtaining her medication.  Denies feelings of being down, depressed, or hopeless in the last 2 weeks.  No recreational or illicit drug use, alcohol, or tobacco.  She continues to take her multivitamins daily.  No Known Allergies    Outpatient Medications Prior to Visit  Medication Sig Dispense  Refill  . abacavir-dolutegravir-lamiVUDine (TRIUMEQ) 600-50-300 MG tablet Take 1 tablet by mouth daily. 30 tablet 9  . ferrous sulfate 325 (65 FE) MG tablet Take 1 tablet (325 mg total) by mouth daily. 30 tablet 0  . Prenatal MV & Min w/FA-DHA (PRENATAL ADULT GUMMY/DHA/FA) 0.4-25 MG CHEW Chew by mouth.    . vitamin B-12 (CYANOCOBALAMIN) 1000 MCG tablet Take 2 tablets (2,000 mcg total) by mouth daily. 60 tablet 0   No facility-administered medications prior to visit.      Past Medical History:  Diagnosis Date  . Acute calculous cholecystitis 05/14/2018  . Anemia   . Anorexia 05/13/2018  . Elevated LFTs   . HIV infection (HCC)   . Malnutrition of moderate degree 05/13/2018     Past Surgical History:  Procedure Laterality Date  . CYST REMOVAL HAND         Review of Systems  Constitutional: Negative for appetite change, chills, diaphoresis, fatigue, fever and unexpected weight change.  Eyes:       Negative for acute change in vision  Respiratory: Negative for chest tightness, shortness of breath and wheezing.   Cardiovascular: Negative for chest pain.  Gastrointestinal: Negative for diarrhea, nausea and vomiting.  Genitourinary: Negative for dysuria, pelvic pain and vaginal discharge.  Musculoskeletal: Negative for neck pain and neck stiffness.  Skin: Negative for rash.  Neurological: Negative for seizures, syncope, weakness and headaches.  Hematological: Negative for adenopathy. Does not bruise/bleed easily.  Psychiatric/Behavioral: Negative for hallucinations.      Objective:    Nursing note and vital signs reviewed.  Monica Henson is pleasant to speak with and sounds to be doing well. Assessment & Plan:   Problem List Items Addressed This Visit      Other   HIV disease affecting pregnancy    Monica Henson continues to have well-controlled HIV disease with good adherence and tolerance to her ART regimen of Triumeq.  Fetal development is on track without complication.  No  signs/symptoms of opportunistic infection or progressive HIV disease at present.  Continue current dose of Triumeq.  We discussed the viral load monitoring and potential for cesarean versus vaginal delivery.  Plan for follow-up and repeat blood work in 2 months or sooner if needed.          I am having Monica Henson maintain her ferrous sulfate, vitamin B-12, abacavir-dolutegravir-lamiVUDine, and Prenatal Adult Gummy/DHA/FA.   I discussed the assessment and treatment plan with the patient. The patient was provided an opportunity to ask questions and all were answered. The patient agreed with the plan and demonstrated an understanding of the instructions.   The patient was advised to call back or seek an in-person evaluation if the symptoms worsen or if the condition fails to improve as anticipated.   I provided 14  minutes of non-face-to-face time during this encounter.  Follow-up: Return in about 2 months (around 04/12/2019), or if symptoms worsen or fail to improve.   Marcos EkeGreg Calone, MSN, FNP-C Nurse Practitioner [facility] Main number: [phone]

## 2019-02-10 NOTE — Assessment & Plan Note (Signed)
Monica Henson continues to have well-controlled HIV disease with good adherence and tolerance to her ART regimen of Triumeq.  Fetal development is on track without complication.  No signs/symptoms of opportunistic infection or progressive HIV disease at present.  Continue current dose of Triumeq.  We discussed the viral load monitoring and potential for cesarean versus vaginal delivery.  Plan for follow-up and repeat blood work in 2 months or sooner if needed.

## 2019-03-31 ENCOUNTER — Ambulatory Visit: Payer: BLUE CROSS/BLUE SHIELD | Admitting: Family

## 2019-04-06 ENCOUNTER — Telehealth: Payer: Self-pay | Admitting: Family

## 2019-04-06 NOTE — Telephone Encounter (Signed)
COVID-19 Pre-Screening Questions: ° °Do you currently have a fever (>100 °F), chills or unexplained body aches? No  ° °Are you currently experiencing new cough, shortness of breath, sore throat, runny nose? No  °•  °Have you recently travelled outside the state of [place] in the last 14 days? No  °•  °1. Have you been in contact with someone that is currently pending confirmation of Covid19 testing or has been confirmed to have the Covid19 virus?  No  ° °

## 2019-04-07 ENCOUNTER — Encounter: Payer: Self-pay | Admitting: Family

## 2019-04-07 ENCOUNTER — Ambulatory Visit (INDEPENDENT_AMBULATORY_CARE_PROVIDER_SITE_OTHER): Payer: BC Managed Care – PPO | Admitting: Family

## 2019-04-07 ENCOUNTER — Other Ambulatory Visit: Payer: Self-pay

## 2019-04-07 VITALS — BP 103/69 | HR 103 | Temp 98.3°F | Wt 151.0 lb

## 2019-04-07 DIAGNOSIS — O98713 Human immunodeficiency virus [HIV] disease complicating pregnancy, third trimester: Secondary | ICD-10-CM

## 2019-04-07 NOTE — Progress Notes (Signed)
Subjective:    Patient ID: Monica Henson, female    DOB: 02/18/1996, 23 y.o.   MRN: [id number]  Chief Complaint  Patient presents with  . HIV Positive/AIDS     HPI:  Monica Henson is a 23 y.o. female HIV disease who was last seen in the office on 02/10/19 during her second trimester of pregnancy with good adherence and tolerance to her ART regimen of Triumeq. She has been followed [facility] the [facility] with her most recent viral load that was undetectable.She is currently [redacted] weeks pregnant.  Monica Henson continues to take her Triumeq as prescribed with no adverse side effects or missed doses. Overall she feels very well with no concerns. Pregnancy continues to go well. Denies fevers, chills, night sweats, headaches, changes in vision, neck pain/stiffness, nausea, diarrhea, vomiting, lesions or rashes.  Monica Henson has coverage [facility] and has no problems obtaining her medication from the pharmacy. Denies feelings of being down, depressed or hopeless. No recreational or illicit drug use, tobacco use or alcohol consumption. She continues to work full time at [employer].     No Known Allergies    Outpatient Medications Prior to Visit  Medication Sig Dispense Refill  . abacavir-dolutegravir-lamiVUDine (TRIUMEQ) 600-50-300 MG tablet Take 1 tablet by mouth daily. 30 tablet 9  . ferrous sulfate 325 (65 FE) MG tablet Take 1 tablet (325 mg total) by mouth daily. 30 tablet 0  . Prenatal MV & Min w/FA-DHA (PRENATAL ADULT GUMMY/DHA/FA) 0.4-25 MG CHEW Chew by mouth.    . vitamin B-12 (CYANOCOBALAMIN) 1000 MCG tablet Take 2 tablets (2,000 mcg total) by mouth daily. 60 tablet 0   No facility-administered medications prior to visit.      Past Medical History:  Diagnosis Date  . Acute calculous cholecystitis 05/14/2018  . Anemia   . Anorexia 05/13/2018  . Elevated LFTs   . HIV infection (HCC)   . Malnutrition of moderate degree 05/13/2018     Past  Surgical History:  Procedure Laterality Date  . CYST REMOVAL HAND         Review of Systems  Constitutional: Negative for appetite change, chills, diaphoresis, fatigue, fever and unexpected weight change.  Eyes:       Negative for acute change in vision  Respiratory: Negative for chest tightness, shortness of breath and wheezing.   Cardiovascular: Negative for chest pain.  Gastrointestinal: Negative for diarrhea, nausea and vomiting.  Genitourinary: Negative for dysuria, pelvic pain and vaginal discharge.  Musculoskeletal: Negative for neck pain and neck stiffness.  Skin: Negative for rash.  Neurological: Negative for seizures, syncope, weakness and headaches.  Hematological: Negative for adenopathy. Does not bruise/bleed easily.  Psychiatric/Behavioral: Negative for hallucinations.      Objective:    BP 103/69   Pulse (!) 103   Temp 98.3 F (36.8 C) (Oral)   Wt 151 lb (68.5 kg)   LMP 09/12/2018   BMI 24.37 kg/m  Nursing note and vital signs reviewed.  Physical Exam Constitutional:      General: She is not in acute distress.    Appearance: She is well-developed.  Eyes:     Conjunctiva/sclera: Conjunctivae normal.  Neck:     Musculoskeletal: Neck supple.  Cardiovascular:     Rate and Rhythm: Normal rate and regular rhythm.     Heart sounds: Normal heart sounds. No murmur. No friction rub. No gallop.   Pulmonary:     Effort: Pulmonary effort is normal. No respiratory distress.  Breath sounds: Normal breath sounds. No wheezing or rales.  Chest:     Chest wall: No tenderness.  Abdominal:     General: Bowel sounds are normal.     Palpations: Abdomen is soft.     Tenderness: There is no abdominal tenderness.  Lymphadenopathy:     Cervical: No cervical adenopathy.  Skin:    General: Skin is warm and dry.     Findings: No rash.  Neurological:     Mental Status: She is alert and oriented to person, place, and time.  Psychiatric:        Behavior: Behavior  normal.        Thought Content: Thought content normal.        Judgment: Judgment normal.        Assessment & Plan:   Problem List Items Addressed This Visit      Other   HIV disease affecting pregnancy - Primary    Ms. Basurto has been doing well with good adherence and tolerance to her ART regimen of Triumeq.  She has no signs/symptoms of opportunistic infection or progressive HIV disease at present.  She remains under prenatal care with obstetrics and the [facility] women/fetal medicine program.  Discussed viral load and what to expect during labor/delivery and possible need for C-section.  Encourage breast-feeding.  We will check blood work today.  Continue current dose of Triumeq.  Plan for follow-up in 4 months or sooner if needed.      Relevant Orders   COMPLETE METABOLIC PANEL WITH GFR   T-helper cell (CD4)- ([facility] clinic only)   HIV-1 RNA quant-no reflex-bld       I am having Monica Henson maintain her ferrous sulfate, vitamin B-12, abacavir-dolutegravir-lamiVUDine, and Prenatal Adult Gummy/DHA/FA.    Follow-up: Return in about 4 months (around 08/07/2019), or if symptoms worsen or fail to improve.   Terri Piedra, MSN, FNP-C Nurse Practitioner [facility] for Infectious Disease [place] number: [phone]

## 2019-04-07 NOTE — Patient Instructions (Signed)
Nice to see you.  Continue to take your Triumeq.   We will check your blood work today.  Plan for follow up in 4 months or sooner if needed.   Good luck with the remainder of your pregnancy.  Have a great day and stay safe!

## 2019-04-07 NOTE — Assessment & Plan Note (Signed)
Monica Henson has been doing well with good adherence and tolerance to her ART regimen of Triumeq.  She has no signs/symptoms of opportunistic infection or progressive HIV disease at present.  She remains under prenatal care with obstetrics and the [facility] women/fetal medicine program.  Discussed viral load and what to expect during labor/delivery and possible need for C-section.  Encourage breast-feeding.  We will check blood work today.  Continue current dose of Triumeq.  Plan for follow-up in 4 months or sooner if needed.

## 2019-04-08 LAB — T-HELPER CELL (CD4) - (RCID CLINIC ONLY)
CD4 % Helper T Cell: 27 % — ABNORMAL LOW (ref 33–65)
CD4 T Cell Abs: 490 /uL (ref 400–1790)

## 2019-04-13 LAB — COMPLETE METABOLIC PANEL WITH GFR
AG Ratio: 1.3 (calc) (ref 1.0–2.5)
ALT: 9 U/L (ref 6–29)
AST: 15 U/L (ref 10–30)
Albumin: 3.5 g/dL — ABNORMAL LOW (ref 3.6–5.1)
Alkaline phosphatase (APISO): 53 U/L (ref 31–125)
BUN: 8 mg/dL (ref 7–25)
CO2: 23 mmol/L (ref 20–32)
Calcium: 8.6 mg/dL (ref 8.6–10.2)
Chloride: 106 mmol/L (ref 98–110)
Creat: 0.55 mg/dL (ref 0.50–1.10)
GFR, Est African American: 154 mL/min/{1.73_m2} (ref >=60)
GFR, Est Non African American: 133 mL/min/{1.73_m2} (ref >=60)
Globulin: 2.6 g/dL (calc) (ref 1.9–3.7)
Glucose, Bld: 76 mg/dL (ref 65–99)
Potassium: 3.9 mmol/L (ref 3.5–5.3)
Sodium: 136 mmol/L (ref 135–146)
Total Bilirubin: 0.4 mg/dL (ref 0.2–1.2)
Total Protein: 6.1 g/dL (ref 6.1–8.1)

## 2019-04-13 LAB — HIV-1 RNA QUANT-NO REFLEX-BLD
HIV 1 RNA Quant: 83 copies/mL — ABNORMAL HIGH
HIV-1 RNA Quant, Log: 1.92 Log copies/mL — ABNORMAL HIGH

## 2019-06-08 ENCOUNTER — Telehealth: Payer: Self-pay | Admitting: *Deleted

## 2019-06-08 NOTE — Telephone Encounter (Signed)
Message received with information reviewed. Overdose on Triumeq is unlikely in given situation and nausea should resolve as time goes on. If she has any nausea medication available that may help with her symptoms. Agree with drinking fluids as needed. Follow up if symptoms are worsening or not improving in the next 24 hours.

## 2019-06-08 NOTE — Telephone Encounter (Signed)
Patient called to report that she may have taken a does of her medication last night and one this morning, because she forgot she took pm dose, and now she feels nauseous. She is concerned that she may have overdosed. Advised the patient will let the provider know and give her a call back once he responds. Advised her to drink plenty of fluids and try to put something on her stomach that the time between doses should be fine she just has a lot of medication in her system and she should feel better by this afternoon.

## 2019-06-08 NOTE — Telephone Encounter (Signed)
Called patient to tell her what Greg's advise and she advised she understands and will call if she doesn't feel better.

## 2019-07-27 ENCOUNTER — Telehealth: Payer: Self-pay | Admitting: Pharmacy Technician

## 2019-07-27 NOTE — Telephone Encounter (Signed)
[facility] Patient Advocate Encounter   Was successful in obtaining a Viiv presumptive fill for Triumeq.  This will bridge her with 1 fill until HMAP is approved.  RxBin: [id number] PCN: [id number] Member ID: [id number] Group ID: [id number]  [facility] [place] Patient [facility] for Infectious Disease Phone: [phone] Fax:  [phone]

## 2019-08-15 ENCOUNTER — Ambulatory Visit: Payer: BC Managed Care – PPO | Admitting: Family

## 2019-09-01 ENCOUNTER — Other Ambulatory Visit: Payer: Self-pay | Admitting: Pharmacist

## 2019-09-01 ENCOUNTER — Encounter: Payer: Self-pay | Admitting: Family

## 2019-09-01 DIAGNOSIS — B20 Human immunodeficiency virus [HIV] disease: Secondary | ICD-10-CM

## 2019-09-01 MED ORDER — TRIUMEQ 600-50-300 MG PO TABS: 1.0000 | ORAL_TABLET | Freq: Every day | ORAL | 0 refills | Status: DC

## 2019-09-01 NOTE — Progress Notes (Signed)
Printing Rx for [employer] patient assistance

## 2019-09-02 ENCOUNTER — Telehealth: Payer: Self-pay | Admitting: Pharmacy Technician

## 2019-09-02 NOTE — Telephone Encounter (Signed)
[facility] Patient Advocate Encounter  Spoke with patient yesterday regarding coverage for Triumeq. She was let go from [employer] and her insurance termed 07/08/2019. When she applied for UMAP it was showing insurance coverage still active. At that time she utilized the 30-day emergency fill with [facility] and picked up this fill in October. She moved to [place], [place] and currently resides with her and her baby and looking for new employment. She stated that she would like to continue using our clinic for her medical care versus [facility] but was not sure when she could make it back for a f/u appointment.  With patient's consent, I filled out the application and faxed to [facility] connect along with script for determination. We were unable to provide her a bottle of medication due to the distance from her home to the clinic. At this time, she has been 3 days without medication. Will continue to check on status with [facility] so we can get medication to the patient as soon as possible.

## 2019-09-22 ENCOUNTER — Other Ambulatory Visit: Payer: Self-pay | Admitting: Family

## 2019-09-22 MED ORDER — BICTEGRAVIR-EMTRICITAB-TENOFOV 50-200-25 MG PO TABS: 1.0000 | ORAL_TABLET | Freq: Every day | ORAL | 5 refills | Status: DC

## 2019-09-22 NOTE — Telephone Encounter (Signed)
[facility] Patient Advocate Encounter  Medication has been switched to [facility] and will be shipped today from [facility] to her new home address in [place], [place].

## 2019-10-12 NOTE — Telephone Encounter (Signed)
[facility] Patient Advocate Encounter  If needed, the patient has been approved to receive medication at no charge from [employer]. The patient is approved until 07/26/2020. The patient must call [phone] to request shipment of medicine. She has been switched back to [place] so this was previously sent for Triumeq coverage.

## 2020-02-14 ENCOUNTER — Other Ambulatory Visit: Payer: Self-pay | Admitting: Family

## 2020-02-14 ENCOUNTER — Telehealth: Payer: Self-pay | Admitting: *Deleted

## 2020-02-14 NOTE — Telephone Encounter (Signed)
RN called patient, left message asking her to call her doctor's office back regarding medication refill.  Unsure if patient has ADAP coverage or other insurance (our records show ADAP expired 01/18/20).  Patient is overdue for labs/visit per Tammy Sours.  OK to have labs drawn locally and virtual visit per Tammy Sours, but patient needs to contact us. Per chart, patient living in [place], [place] now, but would like to continue care at [facility]. However, last office visit/labs were June/July 2020. Andree Coss, RN

## 2020-02-14 NOTE — Telephone Encounter (Signed)
Needs appointment, labs, ADAP per Tammy Sours. Andree Coss, RN

## 2020-02-14 NOTE — Telephone Encounter (Signed)
Patient returned call. She can come to clinic 4/29 at 10:00 for lab and office visit.  She has 10 tablets of biktarvy left. She will renew RW/ADAP same day, will need to speak with pharmacy for short term supply of Biktarvy.  Andree Coss, RN

## 2020-02-15 NOTE — Telephone Encounter (Signed)
Thank you for the FYI

## 2020-02-16 ENCOUNTER — Encounter: Payer: Self-pay | Admitting: Family

## 2020-02-16 ENCOUNTER — Other Ambulatory Visit: Payer: Self-pay

## 2020-02-16 ENCOUNTER — Ambulatory Visit (INDEPENDENT_AMBULATORY_CARE_PROVIDER_SITE_OTHER): Payer: Self-pay | Admitting: Family

## 2020-02-16 ENCOUNTER — Ambulatory Visit: Payer: Self-pay

## 2020-02-16 VITALS — BP 106/68 | HR 78 | Temp 98.6°F | Wt 126.4 lb

## 2020-02-16 DIAGNOSIS — Z113 Encounter for screening for infections with a predominantly sexual mode of transmission: Secondary | ICD-10-CM

## 2020-02-16 DIAGNOSIS — B20 Human immunodeficiency virus [HIV] disease: Secondary | ICD-10-CM

## 2020-02-16 DIAGNOSIS — Z0000 Encounter for general adult medical examination without abnormal findings: Secondary | ICD-10-CM

## 2020-02-16 DIAGNOSIS — Z79899 Other long term (current) drug therapy: Secondary | ICD-10-CM

## 2020-02-16 MED ORDER — BICTEGRAVIR-EMTRICITAB-TENOFOV 50-200-25 MG PO TABS: 1.0000 | ORAL_TABLET | Freq: Every day | ORAL | 5 refills | Status: DC

## 2020-02-16 NOTE — Assessment & Plan Note (Signed)
Monica Henson appears to be doing very well and has no problems with her ART regimen of Biktarvy.  No signs/symptoms of opportunistic infection or progressive HIV disease.  Financial assistance renewed today.  We discussed the plan of care and samples of Biktarvy were provided to bridge until financial assistance is available.  Check blood work today.  Continue current dose of Biktarvy.  Plan for follow-up in 4 months or sooner if needed with lab work 1 to 2 weeks prior to appointment or on same day.

## 2020-02-16 NOTE — Assessment & Plan Note (Signed)
   Discussed importance of safe sexual practice to reduce risk of STI.  Condoms declined.  Discussed possibility of receiving Covid vaccination which she is considering.

## 2020-02-16 NOTE — Progress Notes (Signed)
Subjective:    Patient ID: Monica Henson, female    DOB: 08/08/1996, 24 y.o.   MRN: [id number]  Chief Complaint  Patient presents with  . HIV Positive/AIDS     HPI:  Monica Henson is a 24 y.o. female with HIV disease who was last seen in the office on 04/07/2019 during her third trimester of pregnancy.  She delivered a healthy baby girl who is HIV negative.  Blood work at the time showed a viral load of 83 with a CD4 count of 490.  No recent blood work has been completed.  Monica Henson has continued to take her Biktarvy as prescribed with no adverse side effects or missed doses since her last office visit.  Overall feeling well today with no new concerns/complaints. Denies fevers, chills, night sweats, headaches, changes in vision, neck pain/stiffness, nausea, diarrhea, vomiting, lesions or rashes.  Monica Henson has no problems obtaining her medication from the pharmacy however her benefits have changed as she no longer works at [employer] and does not have insurance currently.  She will be applying for UMAP/ADAP today.  Denies feelings of being down, depressed, or hopeless recently.  No current recreational or illicit drug use.  Smokes approximately three quarters of a pack of cigarettes per day on average and drinks alcohol on occasion.  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   No Known Allergies    Outpatient Medications Prior to Visit  Medication Sig Dispense Refill  . Ferrous Sulfate (IRON) 325 (65 Fe) MG TABS Take by mouth.    . bictegravir-emtricitabine-tenofovir AF (BIKTARVY) 50-200-25 MG TABS tablet Take 1 tablet by mouth daily. 30 tablet 5  . vitamin B-12 (CYANOCOBALAMIN) 1000 MCG tablet Take 2 tablets (2,000 mcg total) by mouth daily. (Patient not taking: Reported on 02/16/2020) 60 tablet 0  . Prenatal MV & Min w/FA-DHA (PRENATAL ADULT GUMMY/DHA/FA) 0.4-25 MG CHEW Chew by mouth.     No facility-administered medications  prior to visit.     Past Medical History:  Diagnosis Date  . Acute calculous cholecystitis 05/14/2018  . Anemia   . Anorexia 05/13/2018  . Elevated LFTs   . HIV infection (HCC)   . Malnutrition of moderate degree 05/13/2018     Past Surgical History:  Procedure Laterality Date  . CYST REMOVAL HAND       Review of Systems  Constitutional: Negative for appetite change, chills, diaphoresis, fatigue, fever and unexpected weight change.  Eyes:       Negative for acute change in vision  Respiratory: Negative for chest tightness, shortness of breath and wheezing.   Cardiovascular: Negative for chest pain.  Gastrointestinal: Negative for diarrhea, nausea and vomiting.  Genitourinary: Negative for dysuria, pelvic pain and vaginal discharge.  Musculoskeletal: Negative for neck pain and neck stiffness.  Skin: Negative for rash.  Neurological: Negative for seizures, syncope, weakness and headaches.  Hematological: Negative for adenopathy. Does not bruise/bleed easily.  Psychiatric/Behavioral: Negative for hallucinations.      Objective:    BP 106/68   Pulse 78   Temp 98.6 F (37 C) (Oral)   Wt 126 lb 6.4 oz (57.3 kg)   Breastfeeding Unknown   BMI 20.40 kg/m  Nursing note and vital signs reviewed.  Physical Exam Constitutional:      General: She is not in acute distress.    Appearance: She is well-developed.  Eyes:     Conjunctiva/sclera: Conjunctivae normal.  Cardiovascular:  Rate and Rhythm: Normal rate and regular rhythm.     Heart sounds: Normal heart sounds. No murmur. No friction rub. No gallop.   Pulmonary:     Effort: Pulmonary effort is normal. No respiratory distress.     Breath sounds: Normal breath sounds. No wheezing or rales.  Chest:     Chest wall: No tenderness.  Abdominal:     General: Bowel sounds are normal.     Palpations: Abdomen is soft.     Tenderness: There is no abdominal tenderness.  Musculoskeletal:     Cervical back: Neck supple.    Lymphadenopathy:     Cervical: No cervical adenopathy.  Skin:    General: Skin is warm and dry.     Findings: No rash.  Neurological:     Mental Status: She is alert and oriented to person, place, and time.  Psychiatric:        Behavior: Behavior normal.        Thought Content: Thought content normal.        Judgment: Judgment normal.      Depression screen [facility] 2/9 04/07/2019 02/10/2019 11/19/2018 10/21/2018 07/22/2018  Decreased Interest 0 0 0 0 0  Down, Depressed, Hopeless 0 0 0 0 0  PHQ - 2 Score 0 0 0 0 0       Assessment & Plan:    Patient Active Problem List   Diagnosis Date Noted  . Healthcare maintenance 02/16/2020  . Anemia, unspecified 05/13/2018  . HIV disease ([place]) 05/07/2018     Problem List Items Addressed This Visit      Other   HIV disease ([place]) - Primary    Monica Henson appears to be doing very well and has no problems with her ART regimen of Biktarvy.  No signs/symptoms of opportunistic infection or progressive HIV disease.  Financial assistance renewed today.  We discussed the plan of care and samples of Biktarvy were provided to bridge until financial assistance is available.  Check blood work today.  Continue current dose of Biktarvy.  Plan for follow-up in 4 months or sooner if needed with lab work 1 to 2 weeks prior to appointment or on same day.       Relevant Medications   bictegravir-emtricitabine-tenofovir AF (BIKTARVY) 50-200-25 MG TABS tablet   Other Relevant Orders   COMPLETE METABOLIC PANEL WITH GFR   HIV-1 RNA quant-no reflex-bld   T-helper cell (CD4)- ([facility] clinic only)   Healthcare maintenance     Discussed importance of safe sexual practice to reduce risk of STI.  Condoms declined.  Discussed possibility of receiving Covid vaccination which she is considering.       Other Visit Diagnoses    Screening for STDs (sexually transmitted diseases)       Relevant Orders   RPR   Pharmacologic therapy       Relevant Orders   Lipid panel        I have discontinued Monica Henson's Prenatal Adult Gummy/DHA/FA. I am also having her maintain her vitamin B-12, Iron, and bictegravir-emtricitabine-tenofovir AF.   Meds ordered this encounter  Medications  . bictegravir-emtricitabine-tenofovir AF (BIKTARVY) 50-200-25 MG TABS tablet    Sig: Take 1 tablet by mouth daily.    Dispense:  30 tablet    Refill:  5    ADAP/UMAP Pending    Order Specific Question:   Supervising Provider    Answer:   Carlyle Basques [[id number]]     Follow-up: Return in about 4 months (around  06/17/2020), or if symptoms worsen or fail to improve.   Marcos Eke, MSN, FNP-C Nurse Practitioner [facility] for Infectious Disease [facility] Main number: [phone]

## 2020-02-16 NOTE — Patient Instructions (Signed)
Nice to see you.  We will check your blood work today.   Continue to take your [place] as prescribed daily.  We will renew your financial assistance today.   Plan for follow up in 3 months or sooner.

## 2020-02-17 ENCOUNTER — Encounter: Payer: Self-pay | Admitting: Family

## 2020-02-17 LAB — FLUORESCENT TREPONEMAL AB(FTA)-IGG-BLD: Fluorescent Treponemal ABS: REACTIVE — AB

## 2020-02-17 LAB — COMPLETE METABOLIC PANEL WITH GFR
AG Ratio: 2 (calc) (ref 1.0–2.5)
ALT: 8 U/L (ref 6–29)
AST: 13 U/L (ref 10–30)
Albumin: 4.8 g/dL (ref 3.6–5.1)
Alkaline phosphatase (APISO): 52 U/L (ref 31–125)
BUN: 12 mg/dL (ref 7–25)
CO2: 25 mmol/L (ref 20–32)
Calcium: 9.5 mg/dL (ref 8.6–10.2)
Chloride: 106 mmol/L (ref 98–110)
Creat: 0.8 mg/dL (ref 0.50–1.10)
GFR, Est African American: 120 mL/min/{1.73_m2} (ref >=60)
GFR, Est Non African American: 104 mL/min/{1.73_m2} (ref >=60)
Globulin: 2.4 g/dL (calc) (ref 1.9–3.7)
Glucose, Bld: 97 mg/dL (ref 65–99)
Potassium: 4.1 mmol/L (ref 3.5–5.3)
Sodium: 139 mmol/L (ref 135–146)
Total Bilirubin: 0.5 mg/dL (ref 0.2–1.2)
Total Protein: 7.2 g/dL (ref 6.1–8.1)

## 2020-02-17 LAB — LIPID PANEL
Cholesterol: 96 mg/dL (ref <200)
HDL: 59 mg/dL (ref >=50)
LDL Cholesterol (Calc): 27 mg/dL (calc)
Non-HDL Cholesterol (Calc): 37 mg/dL (calc) (ref <130)
Total CHOL/HDL Ratio: 1.6 (calc) (ref <5.0)
Triglycerides: 36 mg/dL (ref <150)

## 2020-02-17 LAB — T-HELPER CELL (CD4) - (RCID CLINIC ONLY)
CD4 % Helper T Cell: 30 % — ABNORMAL LOW (ref 33–65)
CD4 T Cell Abs: 569 /uL (ref 400–1790)

## 2020-02-17 LAB — RPR TITER: RPR Titer: 1:1 {titer} — ABNORMAL HIGH

## 2020-02-17 LAB — RPR: RPR Ser Ql: REACTIVE — AB

## 2020-02-17 LAB — HIV-1 RNA QUANT-NO REFLEX-BLD
HIV 1 RNA Quant: <20 copies/mL — AB
HIV-1 RNA Quant, Log: <1.3 Log copies/mL — AB

## 2020-05-08 ENCOUNTER — Encounter: Payer: Self-pay | Admitting: Internal Medicine

## 2020-05-08 ENCOUNTER — Other Ambulatory Visit: Payer: Self-pay

## 2020-05-08 ENCOUNTER — Ambulatory Visit: Payer: Self-pay

## 2020-05-08 ENCOUNTER — Ambulatory Visit (INDEPENDENT_AMBULATORY_CARE_PROVIDER_SITE_OTHER): Payer: Self-pay | Admitting: Family

## 2020-05-08 ENCOUNTER — Encounter: Payer: Self-pay | Admitting: Family

## 2020-05-08 VITALS — BP 108/68 | HR 80 | Temp 98.5°F | Wt 123.0 lb

## 2020-05-08 DIAGNOSIS — Z0000 Encounter for general adult medical examination without abnormal findings: Secondary | ICD-10-CM

## 2020-05-08 DIAGNOSIS — Z113 Encounter for screening for infections with a predominantly sexual mode of transmission: Secondary | ICD-10-CM

## 2020-05-08 DIAGNOSIS — B20 Human immunodeficiency virus [HIV] disease: Secondary | ICD-10-CM

## 2020-05-08 MED ORDER — BICTEGRAVIR-EMTRICITAB-TENOFOV 50-200-25 MG PO TABS: 1.0000 | ORAL_TABLET | Freq: Every day | ORAL | 5 refills | Status: DC

## 2020-05-08 NOTE — Assessment & Plan Note (Signed)
   Discussed/recommended Covid vaccination.  Discussed importance of safe sexual practice to reduce risk of STI. 

## 2020-05-08 NOTE — Assessment & Plan Note (Signed)
Monica Henson continues to have well-controlled HIV disease with good adherence and tolerance to her ART regimen of Biktarvy. No signs/symptoms of opportunistic infection or progressive HIV disease. Reviewed previous lab work and discussed the plan of care. Financial assistance renewed today. Check blood work today. Continue current dose of Biktarvy. Plan for follow-up in 4 months or sooner if needed with lab work on the same day.

## 2020-05-08 NOTE — Progress Notes (Signed)
Subjective:    Patient ID: Monica Henson, female    DOB: 1996-04-29, 24 y.o.   MRN: [id number]  Chief Complaint  Patient presents with   Follow-up    B20     HPI:  Monica Henson is a 24 y.o. female with HIV disease who was last seen in the office on 02/16/2020 with good adherence and tolerance to her ART regimen of Biktarvy. Viral load at the time was undetectable with CD4 count of 569. No recent blood work completed. Here today for routine follow-up.  Monica Henson continues to take her Biktarvy daily as prescribed with no adverse side effects or missed doses since her last office visit. Overall feeling well today with no new concerns/complaints. Denies fevers, chills, night sweats, headaches, changes in vision, neck pain/stiffness, nausea, diarrhea, vomiting, lesions or rashes.  Monica Henson is no problems obtaining her medication from the pharmacy and has renewed her financial assistance today. Denies feelings of being down, depressed, or hopeless recently. Her daughter is now 20 months old and starting to walk. 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 No recreational or illicit drug use or alcohol consumption. Smokes approximately 1/2 pack of cigarettes per day on average. Has not received Covid vaccination to date.   No Known Allergies    Outpatient Medications Prior to Visit  Medication Sig Dispense Refill   Ferrous Sulfate (IRON) 325 (65 Fe) MG TABS Take by mouth.     bictegravir-emtricitabine-tenofovir AF (BIKTARVY) 50-200-25 MG TABS tablet Take 1 tablet by mouth daily. 30 tablet 5   vitamin B-12 (CYANOCOBALAMIN) 1000 MCG tablet Take 2 tablets (2,000 mcg total) by mouth daily. (Patient not taking: Reported on 02/16/2020) 60 tablet 0   No facility-administered medications prior to visit.     Past Medical History:  Diagnosis Date   Acute calculous cholecystitis 05/14/2018   Anemia    Anorexia 05/13/2018   Elevated LFTs    HIV infection  (HCC)    Malnutrition of moderate degree 05/13/2018     Past Surgical History:  Procedure Laterality Date   CYST REMOVAL HAND         Review of Systems  Constitutional: Negative for appetite change, chills, diaphoresis, fatigue, fever and unexpected weight change.  Eyes:       Negative for acute change in vision  Respiratory: Negative for chest tightness, shortness of breath and wheezing.   Cardiovascular: Negative for chest pain.  Gastrointestinal: Negative for diarrhea, nausea and vomiting.  Genitourinary: Negative for dysuria, pelvic pain and vaginal discharge.  Musculoskeletal: Negative for neck pain and neck stiffness.  Skin: Negative for rash.  Neurological: Negative for seizures, syncope, weakness and headaches.  Hematological: Negative for adenopathy. Does not bruise/bleed easily.  Psychiatric/Behavioral: Negative for hallucinations.      Objective:    BP 108/68    Pulse 80    Temp 98.5 F (36.9 C) (Oral)    Wt 123 lb (55.8 kg)    BMI 19.85 kg/m  Nursing note and vital signs reviewed.  Physical Exam Constitutional:      General: She is not in acute distress.    Appearance: She is well-developed.  Eyes:     Conjunctiva/sclera: Conjunctivae normal.  Cardiovascular:     Rate and Rhythm: Normal rate and regular rhythm.     Heart sounds: Normal heart sounds. No murmur heard.  No friction rub. No gallop.   Pulmonary:     Effort: Pulmonary effort is normal. No respiratory distress.  Breath sounds: Normal breath sounds. No wheezing or rales.  Chest:     Chest wall: No tenderness.  Abdominal:     General: Bowel sounds are normal.     Palpations: Abdomen is soft.     Tenderness: There is no abdominal tenderness.  Musculoskeletal:     Cervical back: Neck supple.  Lymphadenopathy:     Cervical: No cervical adenopathy.  Skin:    General: Skin is warm and dry.     Findings: No rash.  Neurological:     Mental Status: She is alert and oriented to person,  place, and time.  Psychiatric:        Behavior: Behavior normal.        Thought Content: Thought content normal.        Judgment: Judgment normal.      Depression screen [facility] 2/9 05/08/2020 04/07/2019 02/10/2019 11/19/2018 10/21/2018  Decreased Interest 0 0 0 0 0  Down, Depressed, Hopeless 0 0 0 0 0  PHQ - 2 Score 0 0 0 0 0       Assessment & Plan:    Patient Active Problem List   Diagnosis Date Noted   Healthcare maintenance 02/16/2020   Anemia, unspecified 05/13/2018   HIV disease (HCC) 05/07/2018     Problem List Items Addressed This Visit      Other   HIV disease (HCC) - Primary    Monica Henson continues to have well-controlled HIV disease with good adherence and tolerance to her ART regimen of Biktarvy. No signs/symptoms of opportunistic infection or progressive HIV disease. Reviewed previous lab work and discussed the plan of care. Financial assistance renewed today. Check blood work today. Continue current dose of Biktarvy. Plan for follow-up in 4 months or sooner if needed with lab work on the same day.      Relevant Medications   bictegravir-emtricitabine-tenofovir AF (BIKTARVY) 50-200-25 MG TABS tablet   Other Relevant Orders   COMPLETE METABOLIC PANEL WITH GFR   HIV-1 RNA quant-no reflex-bld   T-helper cell (CD4)- ([facility] clinic only)   CBC   Healthcare maintenance     Discussed/recommended Covid vaccination.  Discussed importance of safe sexual practice to reduce risk of STI.       Other Visit Diagnoses    Screening for STDs (sexually transmitted diseases)       Relevant Orders   RPR       I am having Estill Dooms maintain her vitamin B-12, Iron, and bictegravir-emtricitabine-tenofovir AF.   Meds ordered this encounter  Medications   bictegravir-emtricitabine-tenofovir AF (BIKTARVY) 50-200-25 MG TABS tablet    Sig: Take 1 tablet by mouth daily.    Dispense:  30 tablet    Refill:  5    Order Specific Question:   Supervising Provider    Answer:    Judyann Munson [[id number]]     Follow-up: Return in about 4 months (around 09/08/2020), or if symptoms worsen or fail to improve.   Marcos Eke, MSN, FNP-C Nurse Practitioner [facility] for Infectious Disease [facility] Main number: [phone]

## 2020-05-08 NOTE — Patient Instructions (Signed)
Nice to see you.  We will check your blood work today.  Continue to take your [place] as prescribed daily.  Refills have been sent to the pharmacy.   Plan for follow up in 4 months or sooner if needed with lab work 1-2 weeks prior visit or e-visit.   Have a great day and stay safe!

## 2020-05-09 LAB — T-HELPER CELL (CD4) - (RCID CLINIC ONLY)
CD4 % Helper T Cell: 30 % — ABNORMAL LOW (ref 33–65)
CD4 T Cell Abs: 632 /uL (ref 400–1790)

## 2020-05-18 LAB — CBC
HCT: 37.6 % (ref 35.0–45.0)
Hemoglobin: 12.5 g/dL (ref 11.7–15.5)
MCH: 30.2 pg (ref 27.0–33.0)
MCHC: 33.2 g/dL (ref 32.0–36.0)
MCV: 90.8 fL (ref 80.0–100.0)
MPV: 10.8 fL (ref 7.5–12.5)
Platelets: 256 10*3/uL (ref 140–400)
RBC: 4.14 10*6/uL (ref 3.80–5.10)
RDW: 11.5 % (ref 11.0–15.0)
WBC: 5.4 10*3/uL (ref 3.8–10.8)

## 2020-05-18 LAB — COMPLETE METABOLIC PANEL WITH GFR
AG Ratio: 2 (calc) (ref 1.0–2.5)
ALT: 9 U/L (ref 6–29)
AST: 14 U/L (ref 10–30)
Albumin: 4.7 g/dL (ref 3.6–5.1)
Alkaline phosphatase (APISO): 55 U/L (ref 31–125)
BUN: 12 mg/dL (ref 7–25)
CO2: 24 mmol/L (ref 20–32)
Calcium: 9.5 mg/dL (ref 8.6–10.2)
Chloride: 107 mmol/L (ref 98–110)
Creat: 0.84 mg/dL (ref 0.50–1.10)
GFR, Est African American: 114 mL/min/{1.73_m2} (ref >=60)
GFR, Est Non African American: 98 mL/min/{1.73_m2} (ref >=60)
Globulin: 2.4 g/dL (calc) (ref 1.9–3.7)
Glucose, Bld: 80 mg/dL (ref 65–99)
Potassium: 4.5 mmol/L (ref 3.5–5.3)
Sodium: 139 mmol/L (ref 135–146)
Total Bilirubin: 0.4 mg/dL (ref 0.2–1.2)
Total Protein: 7.1 g/dL (ref 6.1–8.1)

## 2020-05-18 LAB — HIV-1 RNA QUANT-NO REFLEX-BLD
HIV 1 RNA Quant: <20 copies/mL
HIV-1 RNA Quant, Log: <1.3 Log copies/mL

## 2020-05-18 LAB — RPR TITER: RPR Titer: 1:1 {titer} — ABNORMAL HIGH

## 2020-05-18 LAB — RPR: RPR Ser Ql: REACTIVE — AB

## 2020-05-18 LAB — FLUORESCENT TREPONEMAL AB(FTA)-IGG-BLD: Fluorescent Treponemal ABS: REACTIVE — AB

## 2020-05-29 ENCOUNTER — Telehealth: Payer: Self-pay | Admitting: Pharmacy Technician

## 2020-05-29 NOTE — Telephone Encounter (Signed)
[facility] Patient Advocate Encounter  Left a HIPPA compliant voicemail with the patient.  If she returns call, I reached out to her pharmacy about no delivery of her Biktarvy.  They had set up a second delivery to arrive to her tomorrow.    Netty Starring. Dimas Aguas CPhT Special[facility] Pharmacy Patient [facility] for [facility] Disease Phone: [phone] Fax:  [phone]

## 2020-08-30 ENCOUNTER — Other Ambulatory Visit: Payer: Self-pay

## 2020-08-30 DIAGNOSIS — B20 Human immunodeficiency virus [HIV] disease: Secondary | ICD-10-CM

## 2020-09-03 ENCOUNTER — Other Ambulatory Visit: Payer: Self-pay

## 2020-09-17 ENCOUNTER — Encounter: Payer: Self-pay | Admitting: Internal Medicine

## 2020-12-31 DIAGNOSIS — B20 Human immunodeficiency virus [HIV] disease: Secondary | ICD-10-CM

## 2020-12-31 MED ORDER — BICTEGRAVIR-EMTRICITAB-TENOFOV 50-200-25 MG PO TABS: 1.0000 | ORAL_TABLET | Freq: Every day | ORAL | 0 refills | Status: DC

## 2021-01-10 ENCOUNTER — Ambulatory Visit (INDEPENDENT_AMBULATORY_CARE_PROVIDER_SITE_OTHER): Payer: Self-pay | Admitting: Family

## 2021-01-10 ENCOUNTER — Other Ambulatory Visit: Payer: Self-pay

## 2021-01-10 ENCOUNTER — Encounter: Payer: Self-pay | Admitting: Family

## 2021-01-10 VITALS — BP 117/73 | HR 77 | Wt 122.0 lb

## 2021-01-10 DIAGNOSIS — B20 Human immunodeficiency virus [HIV] disease: Secondary | ICD-10-CM

## 2021-01-10 DIAGNOSIS — Z0000 Encounter for general adult medical examination without abnormal findings: Secondary | ICD-10-CM

## 2021-01-10 DIAGNOSIS — Z113 Encounter for screening for infections with a predominantly sexual mode of transmission: Secondary | ICD-10-CM

## 2021-01-10 DIAGNOSIS — D649 Anemia, unspecified: Secondary | ICD-10-CM

## 2021-01-10 DIAGNOSIS — Z79899 Other long term (current) drug therapy: Secondary | ICD-10-CM

## 2021-01-10 MED ORDER — BICTEGRAVIR-EMTRICITAB-TENOFOV 50-200-25 MG PO TABS: 1.0000 | ORAL_TABLET | Freq: Every day | ORAL | 5 refills | Status: DC

## 2021-01-10 NOTE — Assessment & Plan Note (Signed)
Ms. Salley continues to have well controlled HIV disease with good adherence and tolerance to her ART regimen of Biktarvy. No signs/symptoms of opportunistic infection or progressive HIV disease. Reviewed previous lab work and discussed plan of care. Check lab work today. Continue current dose of Biktarvy. Renew financial assistance today. Plan for follow up in 4 months for routine follow up and Pap smear with Rexene Alberts, NP.

## 2021-01-10 NOTE — Assessment & Plan Note (Signed)
   Discussed importance of safe sexual practice to reduce risk of STI. Condoms declined.   Due for routine dental care with referral placed to [facility] dental clinic.   Schedule Pap smear with Rexene Alberts, NP for routine screening.

## 2021-01-10 NOTE — Progress Notes (Signed)
Brief Narrative   Patient ID: Monica Henson, female    DOB: Nov 28, 1995, 25 y.o.   MRN: [id number]  Monica Henson is a 25 y/o caucasian female with HIV-1 disease diagnosed on 05/04/18 with risk factor of heterosexual contact. Initial blood work with CD4 count of 250 and viral load of 4.96 million. Genotype with Subtype B and no significant resistance patterns. No history of opportunistic infection. Initial medication was Biktarvy. Entered care in CDC Stage 2.  Subjective:    Chief Complaint  Patient presents with  . Follow-up    Declined condoms; declined covid vaccines Needs to renew ADAP; Wants to know about iron levels - reports migraines and headaches that hasn't occurred in a long time;       HPI:  Monica Henson is a 25 y.o. female with HIV disease last seen on 05/08/20 with well controlled virus and good adherence and tolerance to her ART regimen of Biktarvy. Lab work at the time showed viral load that was undetectable and CD4 count of 632. Here today for routine follow up.  [name] continues to take her Biktarvy daily as prescribed with no adverse side effects or missed doses. Overall feeling well today. Would like iron levels checked as she is no longer taking supplementation and has been having increased headaches/migraines.  Daughter continues to do well and is now 72.26 years of age and healthy. Denies fevers, chills, night sweats, changes in vision, neck pain/stiffness, nausea, diarrhea, vomiting, lesions or rashes.  [name] has no problems obtaining medication from the pharmacy. Needs to renew financial assistance today. Denies feelings of being down, depressed or hopeless. No recreational or illicit drug use, tobacco use or alcohol consumption. Due for routine dental care. Declines condoms and vaccines. Healthcare maintenance due includes cervical cancer screening. Working on getting into [employer].    No Known Allergies    Outpatient Medications Prior to Visit   Medication Sig Dispense Refill  . bictegravir-emtricitabine-tenofovir AF (BIKTARVY) 50-200-25 MG TABS tablet Take 1 tablet by mouth daily. 30 tablet 0  . ENSKYCE 0.15-30 MG-MCG tablet Take 1 tablet by mouth daily.    . Ferrous Sulfate (IRON) 325 (65 Fe) MG TABS Take by mouth.    . vitamin B-12 (CYANOCOBALAMIN) 1000 MCG tablet Take 2 tablets (2,000 mcg total) by mouth daily. (Patient not taking: Reported on 02/16/2020) 60 tablet 0   No facility-administered medications prior to visit.     Past Medical History:  Diagnosis Date  . Acute calculous cholecystitis 05/14/2018  . Anemia   . Anorexia 05/13/2018  . Elevated LFTs   . HIV infection (HCC)   . Malnutrition of moderate degree 05/13/2018     Past Surgical History:  Procedure Laterality Date  . CYST REMOVAL HAND         Review of Systems  Constitutional: Negative for appetite change, chills, diaphoresis, fatigue, fever and unexpected weight change.  Eyes:       Negative for acute change in vision  Respiratory: Negative for chest tightness, shortness of breath and wheezing.   Cardiovascular: Negative for chest pain.  Gastrointestinal: Negative for diarrhea, nausea and vomiting.  Genitourinary: Negative for dysuria, pelvic pain and vaginal discharge.  Musculoskeletal: Negative for neck pain and neck stiffness.  Skin: Negative for rash.  Neurological: Negative for seizures, syncope, weakness and headaches.  Hematological: Negative for adenopathy. Does not bruise/bleed easily.  Psychiatric/Behavioral: Negative for hallucinations.      Objective:    BP 117/73   Pulse 77  Wt 122 lb (55.3 kg)   BMI 19.69 kg/m  Nursing note and vital signs reviewed.  Physical Exam Constitutional:      General: She is not in acute distress.    Appearance: She is well-developed.  Eyes:     Conjunctiva/sclera: Conjunctivae normal.  Cardiovascular:     Rate and Rhythm: Normal rate and regular rhythm.     Heart sounds: Normal heart  sounds. No murmur heard. No friction rub. No gallop.   Pulmonary:     Effort: Pulmonary effort is normal. No respiratory distress.     Breath sounds: Normal breath sounds. No wheezing or rales.  Chest:     Chest wall: No tenderness.  Abdominal:     General: Bowel sounds are normal.     Palpations: Abdomen is soft.     Tenderness: There is no abdominal tenderness.  Musculoskeletal:     Cervical back: Neck supple.  Lymphadenopathy:     Cervical: No cervical adenopathy.  Skin:    General: Skin is warm and dry.     Findings: No rash.  Neurological:     Mental Status: She is alert and oriented to person, place, and time.  Psychiatric:        Behavior: Behavior normal.        Thought Content: Thought content normal.        Judgment: Judgment normal.      Depression screen [facility] 2/9 01/10/2021 05/08/2020 04/07/2019 02/10/2019 11/19/2018  Decreased Interest 0 0 0 0 0  Down, Depressed, Hopeless 0 0 0 0 0  PHQ - 2 Score 0 0 0 0 0       Assessment & Plan:    Patient Active Problem List   Diagnosis Date Noted  . Healthcare maintenance 02/16/2020  . Anemia, unspecified 05/13/2018  . HIV disease (HCC) 05/07/2018     Problem List Items Addressed This Visit   None      I am having Estill Dooms maintain her vitamin B-12, Iron, bictegravir-emtricitabine-tenofovir AF, and Enskyce.   No orders of the defined types were placed in this encounter.    Follow-up: No follow-ups on file.   Marcos Eke, MSN, FNP-C Nurse Practitioner [facility] for Infectious Disease [facility] Main number: [phone]

## 2021-01-10 NOTE — Assessment & Plan Note (Signed)
Ms. Dodge has concern for iron deficiency anemia. Check CBC and iron studies today.

## 2021-01-10 NOTE — Patient Instructions (Addendum)
Nice to see you.  We will check your lab work today.   Continue to take your [place] daily as prescribed.   Refills have been sent to the pharmacy.  We will get you set up for the dentist.   Plan for follow up in 4 months or sooner if needed with lab work on the same day with Monica Alberts, NP for Pap smear and financial renewal.   Have a great day and stay safe!

## 2021-01-11 LAB — T-HELPER CELL (CD4) - (RCID CLINIC ONLY)
CD4 % Helper T Cell: 33 % (ref 33–65)
CD4 T Cell Abs: 854 /uL (ref 400–1790)

## 2021-01-12 LAB — COMPLETE METABOLIC PANEL WITH GFR
AG Ratio: 1.7 (calc) (ref 1.0–2.5)
ALT: 9 U/L (ref 6–29)
AST: 17 U/L (ref 10–30)
Albumin: 4.5 g/dL (ref 3.6–5.1)
Alkaline phosphatase (APISO): 35 U/L (ref 31–125)
BUN: 12 mg/dL (ref 7–25)
CO2: 24 mmol/L (ref 20–32)
Calcium: 9.2 mg/dL (ref 8.6–10.2)
Chloride: 104 mmol/L (ref 98–110)
Creat: 0.74 mg/dL (ref 0.50–1.10)
GFR, Est African American: 131 mL/min/{1.73_m2} (ref >=60)
GFR, Est Non African American: 113 mL/min/{1.73_m2} (ref >=60)
Globulin: 2.7 g/dL (calc) (ref 1.9–3.7)
Glucose, Bld: 82 mg/dL (ref 65–99)
Potassium: 4 mmol/L (ref 3.5–5.3)
Sodium: 139 mmol/L (ref 135–146)
Total Bilirubin: 0.4 mg/dL (ref 0.2–1.2)
Total Protein: 7.2 g/dL (ref 6.1–8.1)

## 2021-01-12 LAB — CBC
HCT: 35.3 % (ref 35.0–45.0)
Hemoglobin: 11.8 g/dL (ref 11.7–15.5)
MCH: 30.2 pg (ref 27.0–33.0)
MCHC: 33.4 g/dL (ref 32.0–36.0)
MCV: 90.3 fL (ref 80.0–100.0)
MPV: 10.8 fL (ref 7.5–12.5)
Platelets: 284 10*3/uL (ref 140–400)
RBC: 3.91 10*6/uL (ref 3.80–5.10)
RDW: 12.4 % (ref 11.0–15.0)
WBC: 6.7 10*3/uL (ref 3.8–10.8)

## 2021-01-12 LAB — LIPID PANEL
Cholesterol: 115 mg/dL (ref <200)
HDL: 71 mg/dL (ref >=50)
LDL Cholesterol (Calc): 31 mg/dL (calc)
Non-HDL Cholesterol (Calc): 44 mg/dL (calc) (ref <130)
Total CHOL/HDL Ratio: 1.6 (calc) (ref <5.0)
Triglycerides: 45 mg/dL (ref <150)

## 2021-01-12 LAB — IRON, TOTAL AND TOTAL IRON BINDING CAPACITY (REFL)
%SAT: 14 % (calc) — ABNORMAL LOW (ref 16–45)
Iron: 69 ug/dL (ref 40–190)
TIBC: 487 mcg/dL (calc) — ABNORMAL HIGH (ref 250–450)

## 2021-01-12 LAB — FERRITIN: Ferritin: 29 ng/mL (ref 16–154)

## 2021-01-12 LAB — HIV-1 RNA QUANT-NO REFLEX-BLD
HIV 1 RNA Quant: <20 Copies/mL — ABNORMAL HIGH
HIV-1 RNA Quant, Log: <1.3 Log cps/mL — ABNORMAL HIGH

## 2021-01-12 LAB — RPR: RPR Ser Ql: NONREACTIVE

## 2021-04-01 ENCOUNTER — Encounter: Payer: Self-pay | Admitting: Family

## 2021-04-12 ENCOUNTER — Ambulatory Visit: Payer: Self-pay | Admitting: Family

## 2021-05-29 ENCOUNTER — Encounter: Payer: Self-pay | Admitting: Family

## 2021-05-29 ENCOUNTER — Other Ambulatory Visit: Payer: Self-pay

## 2021-05-29 ENCOUNTER — Ambulatory Visit (INDEPENDENT_AMBULATORY_CARE_PROVIDER_SITE_OTHER): Payer: Self-pay | Admitting: Family

## 2021-05-29 VITALS — BP 123/73 | HR 64 | Temp 98.8°F | Wt 120.2 lb

## 2021-05-29 DIAGNOSIS — B20 Human immunodeficiency virus [HIV] disease: Secondary | ICD-10-CM

## 2021-05-29 DIAGNOSIS — Z113 Encounter for screening for infections with a predominantly sexual mode of transmission: Secondary | ICD-10-CM

## 2021-05-29 DIAGNOSIS — Z0000 Encounter for general adult medical examination without abnormal findings: Secondary | ICD-10-CM

## 2021-05-29 MED ORDER — BICTEGRAVIR-EMTRICITAB-TENOFOV 50-200-25 MG PO TABS: 1.0000 | ORAL_TABLET | Freq: Every day | ORAL | 5 refills | Status: DC

## 2021-05-29 NOTE — Assessment & Plan Note (Signed)
   Discussed importance of safe sexual practice to reduce risk of STI. Condoms declined.   Vaccinations declined.

## 2021-05-29 NOTE — Assessment & Plan Note (Signed)
Monica Henson continues to have well controlled virus with good adherence and tolerance to her ART regimen of Biktarvy. No signs/symptoms of opportunistic infection. Reviewed previous lab work and discussed plan of care. Will renew financial assistance today. Check lab work. Continue current dose of Biktarvy. Plan for follow up in 6 months or sooner if needed with lab work on the same day.

## 2021-05-29 NOTE — Patient Instructions (Signed)
Nice to see you.  We will check your lab work today.  Continue to take your medication daily as prescribed.  Refills have been sent to the pharmacy.  We will renew your financial assistance today.   Plan for follow up in 6 months or sooner if needed with lab work on the same.  Have a great day and stay safe!

## 2021-05-29 NOTE — Progress Notes (Signed)
Brief Narrative   Patient ID: Monica Henson, female    DOB: [birth year], 25 y.o.   MRN: [id number]  Monica Henson is a 25 y/o caucasian female with HIV-1 disease diagnosed on 05/04/18 with risk factor of heterosexual contact. Initial blood work with CD4 count of 250 and viral load of 4.96 million. Genotype with Subtype B and no significant resistance patterns. No history of opportunistic infection. Initial medication was Biktarvy. Entered care in CDC Stage 2.  Subjective:    Chief Complaint  Patient presents with   Follow-up    B20     HPI:  Monica Henson is a 25 y.o. female with HIV disease last seen on 01/10/21 with well controlled virus and good adherence and tolerance to her ART regimen of Biktarvy. Viral load was undetectable and CD4 count 854. Here today for routine follow up.   Monica Henson has been taking her medications daily as prescribed with no adverse side effects or missed doses. Overall feeling well today with no new concerns/complaints. 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 Denies fevers, chills, night sweats, headaches, changes in vision, neck pain/stiffness, nausea, diarrhea, vomiting, lesions or rashes.  Monica Henson has no problems obtaining medications from the pharmacy and is covered through [employer] which she will renew today. Denies feelings of being down, depressed or hopeless recently. No recreational or illicit drug use, tobacco use, or alcohol consumption. Healthcare maintenance due includes Pneumovax and Menveo. Condoms offered.   No Known Allergies    Outpatient Medications Prior to Visit  Medication Sig Dispense Refill   bictegravir-emtricitabine-tenofovir AF (BIKTARVY) 50-200-25 MG TABS tablet Take 1 tablet by mouth daily. 30 tablet 5   ENSKYCE 0.15-30 MG-MCG tablet Take 1 tablet by mouth daily. (Patient not taking: Reported on 05/29/2021)     Ferrous Sulfate (IRON) 325 (65 Fe) MG TABS Take by mouth. (Patient not taking: Reported on 05/29/2021)     vitamin  B-12 (CYANOCOBALAMIN) 1000 MCG tablet Take 2 tablets (2,000 mcg total) by mouth daily. (Patient not taking: No sig reported) 60 tablet 0   No facility-administered medications prior to visit.     Past Medical History:  Diagnosis Date   Acute calculous cholecystitis 05/14/2018   Anemia    Anorexia 05/13/2018   Elevated LFTs    HIV infection (HCC)    Malnutrition of moderate degree 05/13/2018     Past Surgical History:  Procedure Laterality Date   CYST REMOVAL HAND        Review of Systems  Constitutional:  Negative for appetite change, chills, diaphoresis, fatigue, fever and unexpected weight change.  Eyes:        Negative for acute change in vision  Respiratory:  Negative for chest tightness, shortness of breath and wheezing.   Cardiovascular:  Negative for chest pain.  Gastrointestinal:  Negative for diarrhea, nausea and vomiting.  Genitourinary:  Negative for dysuria, pelvic pain and vaginal discharge.  Musculoskeletal:  Negative for neck pain and neck stiffness.  Skin:  Negative for rash.  Neurological:  Negative for seizures, syncope, weakness and headaches.  Hematological:  Negative for adenopathy. Does not bruise/bleed easily.  Psychiatric/Behavioral:  Negative for hallucinations.      Objective:    BP 123/73   Pulse 64   Temp 98.8 F (37.1 C) (Oral)   Wt 120 lb 3.2 oz (54.5 kg)   SpO2 100%   BMI 19.40 kg/m  Nursing note and vital signs reviewed.  Physical Exam Constitutional:      General:  She is not in acute distress.    Appearance: She is well-developed.  Eyes:     Conjunctiva/sclera: Conjunctivae normal.  Cardiovascular:     Rate and Rhythm: Normal rate and regular rhythm.     Heart sounds: Normal heart sounds. No murmur heard.   No friction rub. No gallop.  Pulmonary:     Effort: Pulmonary effort is normal. No respiratory distress.     Breath sounds: Normal breath sounds. No wheezing or rales.  Chest:     Chest wall: No tenderness.  Abdominal:      General: Bowel sounds are normal.     Palpations: Abdomen is soft.     Tenderness: no abdominal tenderness  Musculoskeletal:     Cervical back: Neck supple.  Lymphadenopathy:     Cervical: No cervical adenopathy.  Skin:    General: Skin is warm and dry.     Findings: No rash.  Neurological:     Mental Status: She is alert and oriented to person, place, and time.  Psychiatric:        Behavior: Behavior normal.        Thought Content: Thought content normal.        Judgment: Judgment normal.     Depression screen [facility] 2/9 01/10/2021 05/08/2020 04/07/2019 02/10/2019 11/19/2018  Decreased Interest 0 0 0 0 0  Down, Depressed, Hopeless 0 0 0 0 0  PHQ - 2 Score 0 0 0 0 0       Assessment & Plan:    Patient Active Problem List   Diagnosis Date Noted   Healthcare maintenance 02/16/2020   Anemia, unspecified 05/13/2018   HIV disease (HCC) 05/07/2018     Problem List Items Addressed This Visit       Other   HIV disease (HCC) - Primary    Monica Henson continues to have well controlled virus with good adherence and tolerance to her ART regimen of Biktarvy. No signs/symptoms of opportunistic infection. Reviewed previous lab work and discussed plan of care. Will renew financial assistance today. Check lab work. Continue current dose of Biktarvy. Plan for follow up in 6 months or sooner if needed with lab work on the same day.        Relevant Medications   bictegravir-emtricitabine-tenofovir AF (BIKTARVY) 50-200-25 MG TABS tablet   Other Relevant Orders   COMPLETE METABOLIC PANEL WITH GFR   T-helper cell (CD4)- ([facility] clinic only)   HIV-1 RNA quant-no reflex-bld   Healthcare maintenance    Discussed importance of safe sexual practice to reduce risk of STI. Condoms declined.  Vaccinations declined.        Other Visit Diagnoses     Screening for STDs (sexually transmitted diseases)       Relevant Orders   RPR        I have discontinued Monica Henson's vitamin B-12, Iron,  and Enskyce. I am also having her maintain her bictegravir-emtricitabine-tenofovir AF.   Meds ordered this encounter  Medications   bictegravir-emtricitabine-tenofovir AF (BIKTARVY) 50-200-25 MG TABS tablet    Sig: Take 1 tablet by mouth daily.    Dispense:  30 tablet    Refill:  5    Order Specific Question:   Supervising Provider    Answer:   Judyann Munson [[id number]]     Follow-up: Return in about 6 months (around 11/29/2021), or if symptoms worsen or fail to improve.   Marcos Eke, MSN, FNP-C Nurse Practitioner [facility] for Infectious Disease [facility]  Main number: [phone]

## 2021-05-31 LAB — COMPLETE METABOLIC PANEL WITH GFR
AG Ratio: 1.8 (calc) (ref 1.0–2.5)
ALT: 9 U/L (ref 6–29)
AST: 14 U/L (ref 10–30)
Albumin: 4.5 g/dL (ref 3.6–5.1)
Alkaline phosphatase (APISO): 45 U/L (ref 31–125)
BUN: 10 mg/dL (ref 7–25)
CO2: 27 mmol/L (ref 20–32)
Calcium: 9.4 mg/dL (ref 8.6–10.2)
Chloride: 105 mmol/L (ref 98–110)
Creat: 0.78 mg/dL (ref 0.50–0.96)
Globulin: 2.5 g/dL (calc) (ref 1.9–3.7)
Glucose, Bld: 83 mg/dL (ref 65–99)
Potassium: 4.3 mmol/L (ref 3.5–5.3)
Sodium: 138 mmol/L (ref 135–146)
Total Bilirubin: 0.3 mg/dL (ref 0.2–1.2)
Total Protein: 7 g/dL (ref 6.1–8.1)
eGFR: 109 mL/min/{1.73_m2} (ref >=60)

## 2021-05-31 LAB — T-HELPER CELL (CD4) - (RCID CLINIC ONLY)
CD4 % Helper T Cell: 33 % (ref 33–65)
CD4 T Cell Abs: 967 /uL (ref 400–1790)

## 2021-05-31 LAB — HIV-1 RNA QUANT-NO REFLEX-BLD
HIV 1 RNA Quant: NOT DETECTED Copies/mL
HIV-1 RNA Quant, Log: NOT DETECTED Log cps/mL

## 2021-05-31 LAB — RPR: RPR Ser Ql: NONREACTIVE

## 2021-11-20 ENCOUNTER — Ambulatory Visit (INDEPENDENT_AMBULATORY_CARE_PROVIDER_SITE_OTHER): Payer: Self-pay | Admitting: Family

## 2021-11-20 ENCOUNTER — Other Ambulatory Visit: Payer: Self-pay

## 2021-11-20 ENCOUNTER — Encounter: Payer: Self-pay | Admitting: Family

## 2021-11-20 VITALS — BP 109/71 | HR 85 | Resp 16 | Ht 66.0 in | Wt 120.2 lb

## 2021-11-20 DIAGNOSIS — Z0000 Encounter for general adult medical examination without abnormal findings: Secondary | ICD-10-CM

## 2021-11-20 DIAGNOSIS — Z113 Encounter for screening for infections with a predominantly sexual mode of transmission: Secondary | ICD-10-CM

## 2021-11-20 DIAGNOSIS — B20 Human immunodeficiency virus [HIV] disease: Secondary | ICD-10-CM

## 2021-11-20 MED ORDER — BICTEGRAVIR-EMTRICITAB-TENOFOV 50-200-25 MG PO TABS: 1.0000 | ORAL_TABLET | Freq: Every day | ORAL | 11 refills | Status: DC

## 2021-11-20 NOTE — Assessment & Plan Note (Signed)
Monica Henson continues to have well-controlled virus with good adherence and tolerance to her ART regimen of Biktarvy.  No signs/symptoms of opportunistic infection.  We reviewed previous lab work and discussed plan of care.  Check blood work today.  Continue current dose of Biktarvy.  Financial assistance renewed.  Plan for follow-up in 6 months or sooner if needed with lab work on the same day.

## 2021-11-20 NOTE — Patient Instructions (Addendum)
Nice to see you. ? ?We will check your lab work today. ? ?Continue to take your medication daily as prescribed. ? ?Refills have been sent to the pharmacy. ? ?Plan for follow up in 6 months or sooner if needed with lab work on the same day. ? ?Have a great day and stay safe! ? ?

## 2021-11-20 NOTE — Assessment & Plan Note (Addendum)
·   Discussed importance of safe sexual practices and condom use.  Condoms offered.  Vaccinations offered and declined.  Routine dental care up to date.

## 2021-11-20 NOTE — Progress Notes (Signed)
Brief Narrative   Patient ID: Monica Henson, female    DOB: 1996/06/10, 26 y.o.   MRN: [id number]  Ms. Monica Henson is a 26 y/o caucasian female with HIV-1 disease diagnosed on 05/04/18 with risk factor of heterosexual contact. Initial blood work with CD4 count of 250 and viral load of 4.96 million. Genotype with Subtype B and no significant resistance patterns. No history of opportunistic infection. Initial medication was Biktarvy. Entered care in CDC Stage 2.  Subjective:    Chief Complaint  Patient presents with   Follow-up    HPI:  Monica Henson is a 26 y.o. female with HIV disease last seen on 05/29/2021 with well-controlled virus and good adherence and tolerance to her ART regimen of Biktarvy.  Viral load was undetectable with CD4 count of 967.  Kidney function, liver function, electrolytes within normal ranges.  Here today for routine follow-up.  Monica Henson continues to take her Biktarvy daily as prescribed with no adverse side effects or missed doses.  Overall feeling well today with no new concerns/complaints.  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 Denies fevers, chills, night sweats, headaches, changes in vision, neck pain/stiffness, nausea, diarrhea, vomiting, lesions or rashes.  Monica Henson has no problems obtaining medication from the pharmacy remains covered by [employer].  Denies feelings of being down, depressed, or hopeless recently.  No current recreational illicit drug use or alcohol consumption.  Smokes approximately 1/2 pack of cigarettes per day.  Healthcare maintenance due includes pneumonia vaccine and influenza vaccines.  Condoms offered.   No Known Allergies    Outpatient Medications Prior to Visit  Medication Sig Dispense Refill   bictegravir-emtricitabine-tenofovir AF (BIKTARVY) 50-200-25 MG TABS tablet Take 1 tablet by mouth daily. 30 tablet 5   No facility-administered medications prior to visit.     Past Medical History:  Diagnosis Date   Acute  calculous cholecystitis 05/14/2018   Anemia    Anorexia 05/13/2018   Elevated LFTs    HIV infection (HCC)    Malnutrition of moderate degree 05/13/2018     Past Surgical History:  Procedure Laterality Date   CYST REMOVAL HAND        Review of Systems  Constitutional:  Negative for appetite change, chills, diaphoresis, fatigue, fever and unexpected weight change.  Eyes:        Negative for acute change in vision  Respiratory:  Negative for chest tightness, shortness of breath and wheezing.   Cardiovascular:  Negative for chest pain.  Gastrointestinal:  Negative for diarrhea, nausea and vomiting.  Genitourinary:  Negative for dysuria, pelvic pain and vaginal discharge.  Musculoskeletal:  Negative for neck pain and neck stiffness.  Skin:  Negative for rash.  Neurological:  Negative for seizures, syncope, weakness and headaches.  Hematological:  Negative for adenopathy. Does not bruise/bleed easily.  Psychiatric/Behavioral:  Negative for hallucinations.      Objective:    BP 109/71    Pulse 85    Resp 16    Ht 5\' 6"  (1.676 m)    Wt 120 lb 3.2 oz (54.5 kg)    SpO2 97%    BMI 19.40 kg/m  Nursing note and vital signs reviewed.  Physical Exam Constitutional:      General: She is not in acute distress.    Appearance: She is well-developed.  Eyes:     Conjunctiva/sclera: Conjunctivae normal.  Cardiovascular:     Rate and Rhythm: Normal rate and regular rhythm.     Heart sounds: Normal heart  sounds. No murmur heard.   No friction rub. No gallop.  Pulmonary:     Effort: Pulmonary effort is normal. No respiratory distress.     Breath sounds: Normal breath sounds. No wheezing or rales.  Chest:     Chest wall: No tenderness.  Abdominal:     General: Bowel sounds are normal.     Palpations: Abdomen is soft.     Tenderness: There is no abdominal tenderness.  Musculoskeletal:     Cervical back: Neck supple.  Lymphadenopathy:     Cervical: No cervical adenopathy.  Skin:     General: Skin is warm and dry.     Findings: No rash.  Neurological:     Mental Status: She is alert and oriented to person, place, and time.  Psychiatric:        Behavior: Behavior normal.        Thought Content: Thought content normal.        Judgment: Judgment normal.     Depression screen Monica Henson 2/9 11/20/2021 01/10/2021 05/08/2020 04/07/2019 02/10/2019  Decreased Interest 0 0 0 0 0  Down, Depressed, Hopeless 0 0 0 0 0  PHQ - 2 Score 0 0 0 0 0       Assessment & Plan:    Patient Active Problem List   Diagnosis Date Noted   Healthcare maintenance 02/16/2020   Anemia, unspecified 05/13/2018   HIV disease (HCC) 05/07/2018     Problem List Items Addressed This Visit       Other   HIV disease (HCC) - Primary    Monica Henson continues to have well-controlled virus with good adherence and tolerance to her ART regimen of Biktarvy.  No signs/symptoms of opportunistic infection.  We reviewed previous lab work and discussed plan of care.  Check blood work today.  Continue current dose of Biktarvy.  Financial assistance renewed.  Plan for follow-up in 6 months or sooner if needed with lab work on the same day.      Relevant Medications   bictegravir-emtricitabine-tenofovir AF (BIKTARVY) 50-200-25 MG TABS tablet   Other Relevant Orders   T-helper cells (CD4) count (not at [facility])   Comprehensive metabolic panel   HIV-1 RNA quant-no reflex-bld   Healthcare maintenance    Discussed importance of safe sexual practices and condom use.  Condoms offered. Vaccinations offered and declined. Routine dental care up to date.       Other Visit Diagnoses     Screening for STDs (sexually transmitted diseases)            I am having Monica Henson maintain her bictegravir-emtricitabine-tenofovir AF.   Meds ordered this encounter  Medications   bictegravir-emtricitabine-tenofovir AF (BIKTARVY) 50-200-25 MG TABS tablet    Sig: Take 1 tablet by mouth daily.    Dispense:  30 tablet    Refill:   11    Order Specific Question:   Supervising Provider    Answer:   Judyann Munson [[id number]]     Follow-up: Return in about 6 months (around 05/20/2022), or if symptoms worsen or fail to improve.   Marcos Eke, MSN, FNP-C Nurse Practitioner [facility] for Infectious Disease [facility] Main number: [phone]

## 2021-11-22 LAB — COMPREHENSIVE METABOLIC PANEL
AG Ratio: 2.1 (calc) (ref 1.0–2.5)
ALT: 10 U/L (ref 6–29)
AST: 12 U/L (ref 10–30)
Albumin: 4.8 g/dL (ref 3.6–5.1)
Alkaline phosphatase (APISO): 55 U/L (ref 31–125)
BUN: 14 mg/dL (ref 7–25)
CO2: 26 mmol/L (ref 20–32)
Calcium: 9.5 mg/dL (ref 8.6–10.2)
Chloride: 106 mmol/L (ref 98–110)
Creat: 0.75 mg/dL (ref 0.50–0.96)
Globulin: 2.3 g/dL (calc) (ref 1.9–3.7)
Glucose, Bld: 78 mg/dL (ref 65–99)
Potassium: 4.1 mmol/L (ref 3.5–5.3)
Sodium: 140 mmol/L (ref 135–146)
Total Bilirubin: 0.3 mg/dL (ref 0.2–1.2)
Total Protein: 7.1 g/dL (ref 6.1–8.1)

## 2021-11-22 LAB — HIV-1 RNA QUANT-NO REFLEX-BLD
HIV 1 RNA Quant: NOT DETECTED Copies/mL
HIV-1 RNA Quant, Log: NOT DETECTED Log cps/mL

## 2021-11-22 LAB — T-HELPER CELLS (CD4) COUNT (NOT AT ARMC)
Absolute CD4: 1003 cells/uL (ref 490–1740)
CD4 T Helper %: 35 % (ref 30–61)
Total lymphocyte count: 2892 cells/uL (ref 850–3900)

## 2022-05-27 ENCOUNTER — Encounter: Payer: Self-pay | Admitting: Family

## 2022-05-27 ENCOUNTER — Ambulatory Visit (INDEPENDENT_AMBULATORY_CARE_PROVIDER_SITE_OTHER): Payer: Self-pay | Admitting: Family

## 2022-05-27 ENCOUNTER — Other Ambulatory Visit: Payer: Self-pay

## 2022-05-27 VITALS — BP 101/64 | HR 75 | Temp 97.9°F | Ht 66.0 in | Wt 125.0 lb

## 2022-05-27 DIAGNOSIS — B20 Human immunodeficiency virus [HIV] disease: Secondary | ICD-10-CM

## 2022-05-27 DIAGNOSIS — Z0000 Encounter for general adult medical examination without abnormal findings: Secondary | ICD-10-CM

## 2022-05-27 NOTE — Patient Instructions (Addendum)
Nice to see you. ? ?We will check your lab work today. ? ?Continue to take your medication daily as prescribed. ? ?Refills have been sent to the pharmacy. ? ?Plan for follow up in 6 months or sooner if needed with lab work on the same day. ? ?Have a great day and stay safe! ? ?

## 2022-05-27 NOTE — Assessment & Plan Note (Signed)
   Discussed importance of safe sexual practice, family planning and condom use. Condoms offered.  Declines vaccines  Due for cervical cancer screening. Offered Pap clinic her at [facility].

## 2022-05-27 NOTE — Assessment & Plan Note (Signed)
Monica Henson continues to have well controlled virus with good adherence and tolerance to [place]. Reviewed previous lab work and discussed plan of care. Check lab work. Renew financial assistance. Continue current dose of Biktarvy. Plan for follow up in 6 months or sooner if needed with lab work on the same day.

## 2022-05-27 NOTE — Progress Notes (Signed)
Brief Narrative   Patient ID: Monica Henson, female    DOB: 06-Mar-1996, 26 y.o.   MRN: [id number]  Monica Henson is a 26 y/o caucasian female with HIV-1 disease diagnosed on 05/04/18 with risk factor of heterosexual contact. Initial blood work with CD4 count of 250 and viral load of 4.96 million. Genotype with Subtype B and no significant resistance patterns. No history of opportunistic infection. Initial medication was Biktarvy. Entered care in CDC Stage 2.  Subjective:    Chief Complaint  Patient presents with   Follow-up    HPI:  Monica Henson is a 26 y.o. female with HIV disease last seen on 11/20/21 with well controlled virus and good adherence and tolerance to her ART regimen of Biktarvy. Viral load was undetectable with CD4 count 1,003. Kidney function, liver function and electrolytes within normal ranges. Here today for routine follow up.  Monica Henson has been taking her Biktarvy as prescribed with the occasional missed dose. Feeling well today with no new concerns/complaints. Planning a trip to the beach for her daughter's birthday who will be 3.  Denies fevers, chills, night sweats, headaches, changes in vision, neck pain/stiffness, nausea, diarrhea, vomiting, lesions or rashes.  Monica Henson has no problems obtaining medication from the pharmacy and is covered through [employer]. Denies feelings of being down, depressed or hopeless. 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 No current alcohol consumption or recreational/illicit drug use. Smokes about 0.5 packs of cigarettes per day. Health care maintenance due include Prevnar20, HSV vaccine, and cervical cancer screening.   No Known Allergies   Outpatient Medications Prior to Visit  Medication Sig Dispense Refill   bictegravir-emtricitabine-tenofovir AF (BIKTARVY) 50-200-25 MG TABS tablet Take 1 tablet by mouth daily. 30 tablet 11   No facility-administered medications prior to visit.     Past Medical History:  Diagnosis Date    Acute calculous cholecystitis 05/14/2018   Anemia    Anorexia 05/13/2018   Elevated LFTs    HIV infection (HCC)    Malnutrition of moderate degree 05/13/2018     Past Surgical History:  Procedure Laterality Date   CYST REMOVAL HAND      Review of Systems  Constitutional:  Negative for appetite change, chills, diaphoresis, fatigue, fever and unexpected weight change.  Eyes:        Negative for acute change in vision  Respiratory:  Negative for chest tightness, shortness of breath and wheezing.   Cardiovascular:  Negative for chest pain.  Gastrointestinal:  Negative for diarrhea, nausea and vomiting.  Genitourinary:  Positive for flank pain. Negative for dysuria, pelvic pain and vaginal discharge.  Musculoskeletal:  Negative for neck pain and neck stiffness.  Skin:  Negative for rash.  Neurological:  Negative for seizures, syncope, weakness and headaches.  Hematological:  Negative for adenopathy. Does not bruise/bleed easily.  Psychiatric/Behavioral:  Negative for hallucinations.       Objective:    BP 101/64   Pulse 75   Temp 97.9 F (36.6 C) (Oral)   Ht 5\' 6"  (1.676 m)   Wt 125 lb (56.7 kg)   SpO2 100%   BMI 20.18 kg/m  Nursing note and vital signs reviewed.  Physical Exam Constitutional:      General: She is not in acute distress.    Appearance: She is well-developed.  Eyes:     Conjunctiva/sclera: Conjunctivae normal.  Cardiovascular:     Rate and Rhythm: Normal rate and regular rhythm.     Heart sounds: Normal heart  sounds. No murmur heard.    No friction rub. No gallop.  Pulmonary:     Effort: Pulmonary effort is normal. No respiratory distress.     Breath sounds: Normal breath sounds. No wheezing or rales.  Chest:     Chest wall: No tenderness.  Abdominal:     General: Bowel sounds are normal.     Palpations: Abdomen is soft.     Tenderness: There is no abdominal tenderness.  Musculoskeletal:     Cervical back: Neck supple.  Lymphadenopathy:      Cervical: No cervical adenopathy.  Skin:    General: Skin is warm and dry.     Findings: No rash.  Neurological:     Mental Status: She is alert and oriented to person, place, and time.  Psychiatric:        Behavior: Behavior normal.        Thought Content: Thought content normal.        Judgment: Judgment normal.         05/27/2022   10:58 AM 11/20/2021    2:03 PM 01/10/2021    3:55 PM 05/08/2020   10:11 AM 04/07/2019   11:33 AM  Depression screen PHQ 2/9  Decreased Interest 0 0 0 0 0  Down, Depressed, Hopeless 0 0 0 0 0  PHQ - 2 Score 0 0 0 0 0       Assessment & Plan:    Patient Active Problem List   Diagnosis Date Noted   Healthcare maintenance 02/16/2020   Anemia, unspecified 05/13/2018   HIV disease (HCC) 05/07/2018     Problem List Items Addressed This Visit       Other   HIV disease (HCC) - Primary    Monica Henson continues to have well controlled virus with good adherence and tolerance to [employer]. Reviewed previous lab work and discussed plan of care. Check lab work. Renew financial assistance. Continue current dose of Biktarvy. Plan for follow up in 6 months or sooner if needed with lab work on the same day.       Relevant Orders   Comprehensive metabolic panel   HIV-1 RNA quant-no reflex-bld   T-helper cell (CD4)- ([facility] clinic only)   Healthcare maintenance    Discussed importance of safe sexual practice, family planning and condom use. Condoms offered. Declines vaccines Due for cervical cancer screening. Offered Pap clinic her at [facility].         I am having Monica Henson maintain her bictegravir-emtricitabine-tenofovir AF.   Follow-up: Return in about 6 months (around 11/27/2022), or if symptoms worsen or fail to improve.   Marcos Eke, MSN, FNP-C Nurse Practitioner [facility] for Infectious Disease [facility] Main number: [phone]

## 2022-05-28 LAB — T-HELPER CELL (CD4) - (RCID CLINIC ONLY)
CD4 % Helper T Cell: 37 % (ref 33–65)
CD4 T Cell Abs: 854 /uL (ref 400–1790)

## 2022-05-29 LAB — COMPREHENSIVE METABOLIC PANEL
AG Ratio: 1.8 (calc) (ref 1.0–2.5)
ALT: 12 U/L (ref 6–29)
AST: 14 U/L (ref 10–30)
Albumin: 4.7 g/dL (ref 3.6–5.1)
Alkaline phosphatase (APISO): 50 U/L (ref 31–125)
BUN: 13 mg/dL (ref 7–25)
CO2: 26 mmol/L (ref 20–32)
Calcium: 9.4 mg/dL (ref 8.6–10.2)
Chloride: 107 mmol/L (ref 98–110)
Creat: 0.79 mg/dL (ref 0.50–0.96)
Globulin: 2.6 g/dL (calc) (ref 1.9–3.7)
Glucose, Bld: 86 mg/dL (ref 65–99)
Potassium: 4.2 mmol/L (ref 3.5–5.3)
Sodium: 139 mmol/L (ref 135–146)
Total Bilirubin: 0.4 mg/dL (ref 0.2–1.2)
Total Protein: 7.3 g/dL (ref 6.1–8.1)

## 2022-05-29 LAB — HIV-1 RNA QUANT-NO REFLEX-BLD
HIV 1 RNA Quant: NOT DETECTED Copies/mL
HIV-1 RNA Quant, Log: NOT DETECTED Log cps/mL

## 2022-11-13 ENCOUNTER — Ambulatory Visit: Payer: Self-pay | Admitting: Family

## 2022-11-13 ENCOUNTER — Ambulatory Visit: Payer: Self-pay

## 2022-11-20 ENCOUNTER — Ambulatory Visit: Payer: Self-pay | Admitting: Family

## 2022-11-20 ENCOUNTER — Ambulatory Visit: Payer: Self-pay

## 2022-11-21 ENCOUNTER — Other Ambulatory Visit: Payer: Self-pay | Admitting: Family

## 2022-11-21 DIAGNOSIS — B20 Human immunodeficiency virus [HIV] disease: Secondary | ICD-10-CM

## 2022-12-11 ENCOUNTER — Other Ambulatory Visit: Payer: Self-pay

## 2022-12-11 ENCOUNTER — Encounter: Payer: Self-pay | Admitting: Family

## 2022-12-11 ENCOUNTER — Ambulatory Visit: Payer: Self-pay | Admitting: Family

## 2022-12-11 VITALS — BP 119/78 | HR 87 | Temp 98.0°F | Wt 127.4 lb

## 2022-12-11 DIAGNOSIS — Z0000 Encounter for general adult medical examination without abnormal findings: Secondary | ICD-10-CM

## 2022-12-11 DIAGNOSIS — B20 Human immunodeficiency virus [HIV] disease: Secondary | ICD-10-CM

## 2022-12-11 DIAGNOSIS — F439 Reaction to severe stress, unspecified: Secondary | ICD-10-CM

## 2022-12-11 MED ORDER — BIKTARVY 50-200-25 MG PO TABS: 1.0000 | ORAL_TABLET | Freq: Every day | ORAL | 11 refills | Status: DC

## 2022-12-11 NOTE — Assessment & Plan Note (Signed)
Monica Henson continues to have well controlled virus with good adherence and tolerance to [employer]. Reviewed previous lab work and discussed plan of care. Will need to renew financial assistance after July 1st and is currently up to date. Check lab work. Continue current dose of Biktarvy. Plan for follow up in 6 months or sooner if needed with lab work on the same day.

## 2022-12-11 NOTE — Patient Instructions (Signed)
Nice to see you.  We will check your lab work today.  Continue to take your medication daily as prescribed.  Refills have been sent to the pharmacy.  Renew financial assistance after July 1st   Plan for follow up in 1 months or sooner if needed with lab work on the same day.  Have a great day and stay safe!

## 2022-12-11 NOTE — Assessment & Plan Note (Signed)
Discussed importance of safe sexual practice and condom use. Condoms and STD testing offered.  Declined vaccinations Due for Pap smear for cervical cancer screening and recommended appointment with [facility] Pap clinic.

## 2022-12-11 NOTE — Progress Notes (Signed)
Brief Narrative   Patient ID: Monica Henson, female    DOB: [birth year], 27 y.o.   MRN: [id number]  Monica Henson is a 27 y/o caucasian female with HIV-1 disease diagnosed on 05/04/18 with risk factor of heterosexual contact. Initial blood work with CD4 count of 250 and viral load of 4.96 million. Genotype with Subtype B and no significant resistance patterns. No history of opportunistic infection. Initial medication was Biktarvy. Entered care in CDC Stage 2.   Subjective:    Chief Complaint  Patient presents with   Follow-up    +    HPI:  Monica Henson is a 27 y.o. female with HIV disease last seen on 05/27/22 with well controlled virus and good adherence and tolerance to Biktarvy. Viral load was undetectable and CD4 count 854. Renal function, hepatic function and electrolytes within normal ranges. Here today for routine follow up.  Monica Henson has increased levels of stress recently secondary to lifestyle events. 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 Now current living with her mother and has separated from her previous partner. She is working on Radiographer, therapeutic. Continues to take her Biktarvy with no adverse side effects and no problems obtaining medication from the pharmacy. Financial assistance is up to date. Condoms and STD testing offered.   Denies fevers, chills, night sweats, headaches, changes in vision, neck pain/stiffness, nausea, diarrhea, vomiting, lesions or rashes.    No Known Allergies    Outpatient Medications Prior to Visit  Medication Sig Dispense Refill   APRI 0.15-30 MG-MCG tablet Take 1 tablet by mouth daily.     bictegravir-emtricitabine-tenofovir AF (BIKTARVY) 50-200-25 MG TABS tablet TAKE 1 TABLET BY MOUTH DAILY 30 tablet 0   No facility-administered medications prior to visit.     Past Medical History:  Diagnosis Date   Acute calculous cholecystitis 05/14/2018   Anemia    Anorexia  05/13/2018   Elevated LFTs    HIV infection (HCC)    Malnutrition of moderate degree 05/13/2018     Past Surgical History:  Procedure Laterality Date   CYST REMOVAL HAND        Review of Systems  Constitutional:  Negative for appetite change, chills, diaphoresis, fatigue, fever and unexpected weight change.  Eyes:        Negative for acute change in vision  Respiratory:  Negative for chest tightness, shortness of breath and wheezing.   Cardiovascular:  Negative for chest pain.  Gastrointestinal:  Negative for diarrhea, nausea and vomiting.  Genitourinary:  Negative for dysuria, pelvic pain and vaginal discharge.  Musculoskeletal:  Negative for neck pain and neck stiffness.  Skin:  Negative for rash.  Neurological:  Negative for seizures, syncope, weakness and headaches.  Hematological:  Negative for adenopathy. Does not bruise/bleed easily.  Psychiatric/Behavioral:  Negative for hallucinations.       Objective:    BP 119/78   Pulse 87   Temp 98 F (36.7 C) (Oral)   Wt 127 lb 6.4 oz (57.8 kg)   LMP 11/20/2022   SpO2 100%   BMI 20.56 kg/m  Nursing note and vital signs reviewed.  Physical Exam Constitutional:      General: She is not in acute distress.    Appearance: She is well-developed.  Eyes:     Conjunctiva/sclera: Conjunctivae normal.  Cardiovascular:     Rate and Rhythm: Normal rate and regular rhythm.     Heart sounds: Normal heart  sounds. No murmur heard.    No friction rub. No gallop.  Pulmonary:     Effort: Pulmonary effort is normal. No respiratory distress.     Breath sounds: Normal breath sounds. No wheezing or rales.  Chest:     Chest wall: No tenderness.  Abdominal:     General: Bowel sounds are normal.     Palpations: Abdomen is soft.     Tenderness: There is no abdominal tenderness.  Musculoskeletal:     Cervical back: Neck supple.  Lymphadenopathy:     Cervical: No cervical adenopathy.  Skin:    General: Skin is warm and dry.      Findings: No rash.  Neurological:     Mental Status: She is alert and oriented to person, place, and time.  Psychiatric:        Behavior: Behavior normal.        Thought Content: Thought content normal.        Judgment: Judgment normal.         12/11/2022   10:40 AM 05/27/2022   10:58 AM 11/20/2021    2:03 PM 01/10/2021    3:55 PM 05/08/2020   10:11 AM  Depression screen PHQ 2/9  Decreased Interest 0 0 0 0 0  Down, Depressed, Hopeless 0 0 0 0 0  PHQ - 2 Score 0 0 0 0 0       Assessment & Plan:    Patient Active Problem List   Diagnosis Date Noted   Stress 12/11/2022   Healthcare maintenance 02/16/2020   Anemia, unspecified 05/13/2018   HIV disease ([place]) 05/07/2018     Problem List Items Addressed This Visit       Other   HIV disease ([place]) - Primary    Monica Henson continues to have well controlled virus with good adherence and tolerance to [employer]. Reviewed previous lab work and discussed plan of care. Will need to renew financial assistance after July 1st and is currently up to date. Check lab work. Continue current dose of Biktarvy. Plan for follow up in 6 months or sooner if needed with lab work on the same day.       Relevant Medications   bictegravir-emtricitabine-tenofovir AF (BIKTARVY) 50-200-25 MG TABS tablet   Other Relevant Orders   BASIC METABOLIC PANEL WITH GFR   T-helper cells (CD4) count (not at [facility])   HIV-1 RNA quant-no reflex-bld   Healthcare maintenance    Discussed importance of safe sexual practice and condom use. Condoms and STD testing offered.  Declined vaccinations Due for Pap smear for cervical cancer screening and recommended appointment with [facility] Pap clinic.      Stress    Monica Henson has increased levels of stress secondary to several recent lifestyle events. She appears to be coping well at the present time and has a plan in place. Informed counseling is available if she needs it. Reviewed available resources.         I am having Monica Henson maintain her [employer] and Biktarvy.   Meds ordered this encounter  Medications   bictegravir-emtricitabine-tenofovir AF (BIKTARVY) 50-200-25 MG TABS tablet    Sig: Take 1 tablet by mouth daily.    Dispense:  30 tablet    Refill:  11    Order Specific Question:   Supervising Provider    Answer:   Carlyle Basques [[id number]]     Follow-up: Return in about 6 months (around 06/11/2023), or if symptoms worsen or fail to improve.   Marya Amsler  Elna Breslow, MSN, FNP-C Nurse Practitioner [facility] [place] number: [phone]

## 2022-12-11 NOTE — Assessment & Plan Note (Signed)
Monica Henson has increased levels of stress secondary to several recent lifestyle events. She appears to be coping well at the present time and has a plan in place. Informed counseling is available if she needs it. Reviewed available resources.

## 2022-12-12 LAB — T-HELPER CELLS (CD4) COUNT (NOT AT ARMC)
CD4 % Helper T Cell: 38 % (ref 33–65)
CD4 T Cell Abs: 832 /uL (ref 400–1790)

## 2022-12-14 LAB — BASIC METABOLIC PANEL WITH GFR
BUN: 8 mg/dL (ref 7–25)
CO2: 23 mmol/L (ref 20–32)
Calcium: 9.9 mg/dL (ref 8.6–10.2)
Chloride: 106 mmol/L (ref 98–110)
Creat: 0.76 mg/dL (ref 0.50–0.96)
Glucose, Bld: 94 mg/dL (ref 65–99)
Potassium: 4.1 mmol/L (ref 3.5–5.3)
Sodium: 139 mmol/L (ref 135–146)
eGFR: 111 mL/min/{1.73_m2} (ref >=60)

## 2022-12-14 LAB — HIV-1 RNA QUANT-NO REFLEX-BLD
HIV 1 RNA Quant: NOT DETECTED Copies/mL
HIV-1 RNA Quant, Log: NOT DETECTED Log cps/mL

## 2023-06-02 ENCOUNTER — Encounter: Payer: Self-pay | Admitting: Family

## 2023-06-02 ENCOUNTER — Ambulatory Visit (INDEPENDENT_AMBULATORY_CARE_PROVIDER_SITE_OTHER): Payer: Self-pay | Admitting: Family

## 2023-06-02 ENCOUNTER — Other Ambulatory Visit: Payer: Self-pay

## 2023-06-02 VITALS — BP 105/62 | HR 69 | Temp 98.4°F | Ht 66.0 in | Wt 126.0 lb

## 2023-06-02 DIAGNOSIS — B20 Human immunodeficiency virus [HIV] disease: Secondary | ICD-10-CM

## 2023-06-02 DIAGNOSIS — Z0000 Encounter for general adult medical examination without abnormal findings: Secondary | ICD-10-CM

## 2023-06-02 MED ORDER — BIKTARVY 50-200-25 MG PO TABS: 1.0000 | ORAL_TABLET | Freq: Every day | ORAL | 6 refills | Status: DC

## 2023-06-02 NOTE — Progress Notes (Signed)
Brief Narrative   Patient ID: Monica Henson, female    DOB: 11-11-1995, 27 y.o.   MRN: [id number]  Monica Henson is a 27 y/o caucasian female with HIV-1 disease diagnosed on 05/04/18 with risk factor of heterosexual contact. Initial blood work with CD4 count of 250 and viral load of 4.96 million. Genotype with Subtype B and no significant resistance patterns. No history of opportunistic infection. Initial medication was Biktarvy. Entered care in CDC Stage 2.   Subjective:    Chief Complaint  Patient presents with   Follow-up   HIV Positive/AIDS    HPI:  Monica Henson is a 27 y.o. female with HIV disease last seen in 12/11/2022 with well-controlled virus and good adherence and tolerance to [employer].  Viral load was undetectable CD4 count 832.  Kidney function and electrolytes within normal ranges.  Here today for routine follow-up.  Monica Henson continues to do well since her last office visit and continues to take [place] as prescribed with no adverse side effects with occasional missed doses.  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  No new concerns/complaints.  Labs and STD testing offered.  Declines vaccinations.  Denies fevers, chills, night sweats, headaches, changes in vision, neck pain/stiffness, nausea, diarrhea, vomiting, lesions or rashes.    No Known Allergies    Outpatient Medications Prior to Visit  Medication Sig Dispense Refill   bictegravir-emtricitabine-tenofovir AF (BIKTARVY) 50-200-25 MG TABS tablet Take 1 tablet by mouth daily. 30 tablet 11   APRI 0.15-30 MG-MCG tablet Take 1 tablet by mouth daily. (Patient not taking: Reported on 06/02/2023)     No facility-administered medications prior to visit.     Past Medical History:  Diagnosis Date   Acute calculous cholecystitis 05/14/2018   Anemia    Anorexia 05/13/2018   Elevated LFTs    HIV infection  (HCC)    Malnutrition of moderate degree 05/13/2018     Past Surgical History:  Procedure Laterality Date   CYST REMOVAL HAND        Review of Systems  Constitutional:  Negative for appetite change, chills, diaphoresis, fatigue, fever and unexpected weight change.  Eyes:        Negative for acute change in vision  Respiratory:  Negative for chest tightness, shortness of breath and wheezing.   Cardiovascular:  Negative for chest pain.  Gastrointestinal:  Negative for diarrhea, nausea and vomiting.  Genitourinary:  Negative for dysuria, pelvic pain and vaginal discharge.  Musculoskeletal:  Negative for neck pain and neck stiffness.  Skin:  Negative for rash.  Neurological:  Negative for seizures, syncope, weakness and headaches.  Hematological:  Negative for adenopathy. Does not bruise/bleed easily.  Psychiatric/Behavioral:  Negative for hallucinations.       Objective:    BP 105/62   Pulse 69   Temp 98.4 F (36.9 C) (Temporal)   Ht 5\' 6"  (1.676 m)   Wt 126 lb (57.2 kg)   SpO2 100%   BMI 20.34 kg/m  Nursing note and vital signs reviewed.  Physical Exam Constitutional:      General: She is not in acute distress.    Appearance: She is well-developed.  Eyes:     Conjunctiva/sclera: Conjunctivae normal.  Cardiovascular:     Rate and Rhythm: Normal rate and regular rhythm.     Heart sounds: Normal heart sounds. No murmur heard.    No  friction rub. No gallop.  Pulmonary:     Effort: Pulmonary effort is normal. No respiratory distress.     Breath sounds: Normal breath sounds. No wheezing or rales.  Chest:     Chest wall: No tenderness.  Abdominal:     General: Bowel sounds are normal.     Palpations: Abdomen is soft.     Tenderness: There is no abdominal tenderness.  Musculoskeletal:     Cervical back: Neck supple.  Lymphadenopathy:     Cervical: No cervical adenopathy.  Skin:    General: Skin is warm and dry.     Findings: No rash.  Neurological:     Mental  Status: She is alert and oriented to person, place, and time.  Psychiatric:        Behavior: Behavior normal.        Thought Content: Thought content normal.        Judgment: Judgment normal.         06/02/2023    9:26 AM 12/11/2022   10:40 AM 05/27/2022   10:58 AM 11/20/2021    2:03 PM 01/10/2021    3:55 PM  Depression screen PHQ 2/9  Decreased Interest 0 0 0 0 0  Down, Depressed, Hopeless 0 0 0 0 0  PHQ - 2 Score 0 0 0 0 0       Assessment & Plan:    Patient Active Problem List   Diagnosis Date Noted   Stress 12/11/2022   Healthcare maintenance 02/16/2020   Anemia, unspecified 05/13/2018   HIV disease (HCC) 05/07/2018     Problem List Items Addressed This Visit       Other   HIV disease (HCC)    [name] continues to have well-controlled virus with good adherence and tolerance to [employer].  Reviewed lab work and discussed plan of care and U equals U.  Check blood work.  Continue current dose of Biktarvy.  Will need to renew financial assistance and/or apply for Medicaid.  Plan for follow-up in 6 months or sooner if needed with lab work on the same day      Relevant Medications   bictegravir-emtricitabine-tenofovir AF (BIKTARVY) 50-200-25 MG TABS tablet   Other Relevant Orders   COMPLETE METABOLIC PANEL WITH GFR   T-helper cell (CD4)- ([facility] clinic only)   HIV-1 RNA quant-no reflex-bld   Healthcare maintenance - Primary    Discussed importance of safe sexual practice and condom use. Condoms and STD testing offered.  Reviewed and discussed vaccinations which were declined. Working on scheduling routine dental care independently.        I have discontinued [employer]. I am also having her maintain her Biktarvy.   Meds ordered this encounter  Medications   bictegravir-emtricitabine-tenofovir AF (BIKTARVY) 50-200-25 MG TABS tablet    Sig: Take 1 tablet by mouth daily.    Dispense:  30 tablet    Refill:  6    Order Specific Question:   Supervising  Provider    Answer:   Judyann Munson [[id number]]     Follow-up: Return in about 6 months (around 12/03/2023), or if symptoms worsen or fail to improve.   Marcos Eke, MSN, FNP-C Nurse Practitioner [facility]ious Disease [facility] Main number: [phone]

## 2023-06-02 NOTE — Assessment & Plan Note (Signed)
Keymani continues to have well-controlled virus with good adherence and tolerance to [employer].  Reviewed lab work and discussed plan of care and U equals U.  Check blood work.  Continue current dose of Biktarvy.  Will need to renew financial assistance and/or apply for Medicaid.  Plan for follow-up in 6 months or sooner if needed with lab work on the same day

## 2023-06-02 NOTE — Patient Instructions (Addendum)
Nice to see you. ? ?We will check your lab work today. ? ?Continue to take your medication daily as prescribed. ? ?Refills have been sent to the pharmacy. ? ?Plan for follow up in 6 months or sooner if needed with lab work on the same day. ? ?Have a great day and stay safe! ? ?

## 2023-06-02 NOTE — Assessment & Plan Note (Signed)
Discussed importance of safe sexual practice and condom use. Condoms and STD testing offered.  Reviewed and discussed vaccinations which were declined. Working on scheduling routine dental care independently.

## 2023-12-07 ENCOUNTER — Telehealth: Payer: Self-pay

## 2023-12-07 ENCOUNTER — Ambulatory Visit: Payer: 59 | Admitting: Family

## 2023-12-07 ENCOUNTER — Other Ambulatory Visit (HOSPITAL_COMMUNITY): Payer: Self-pay

## 2023-12-07 ENCOUNTER — Other Ambulatory Visit: Payer: Self-pay

## 2023-12-07 ENCOUNTER — Other Ambulatory Visit: Payer: Self-pay | Admitting: Pharmacist

## 2023-12-07 ENCOUNTER — Encounter: Payer: Self-pay | Admitting: Family

## 2023-12-07 VITALS — BP 108/67 | HR 70 | Temp 98.1°F | Resp 16 | Wt 121.4 lb

## 2023-12-07 DIAGNOSIS — Z0000 Encounter for general adult medical examination without abnormal findings: Secondary | ICD-10-CM

## 2023-12-07 DIAGNOSIS — F439 Reaction to severe stress, unspecified: Secondary | ICD-10-CM

## 2023-12-07 DIAGNOSIS — B20 Human immunodeficiency virus [HIV] disease: Secondary | ICD-10-CM

## 2023-12-07 DIAGNOSIS — Z720 Tobacco use: Secondary | ICD-10-CM | POA: Insufficient documentation

## 2023-12-07 MED ORDER — BIKTARVY 50-200-25 MG PO TABS
1.0000 | ORAL_TABLET | Freq: Every day | ORAL | 6 refills | Status: DC
  Filled 2023-12-07: qty 30, 30d supply, fill #0
  Filled 2023-12-31 (×2): qty 30, 30d supply, fill #1
  Filled 2024-02-17: qty 30, 30d supply, fill #2
  Filled 2024-04-03 – 2024-04-04 (×2): qty 30, 30d supply, fill #3
  Filled 2024-05-04 – 2024-05-11 (×3): qty 30, 30d supply, fill #4

## 2023-12-07 NOTE — Progress Notes (Signed)
 Specialty Pharmacy Initial Fill Coordination Note  Monica Henson is a 28 y.o. female contacted today regarding initial fill of specialty medication(s) Bictegravir-Emtricitab-Tenofov [facility])   Patient requested Delivery   Delivery date: 12/09/23   Verified address: [address] [place] [address]   Medication will be filled on 12/08/23.   Patient is aware of $0 copayment.

## 2023-12-07 NOTE — Assessment & Plan Note (Signed)
 This point is currently vaping with a cartridge lasting about 2 to 3 weeks.  Discussed importance of tobacco cessation to reduce risk of disease development and complications in the future.  Information provided in after visit summary.  Not ready to quit at this time.

## 2023-12-07 NOTE — Assessment & Plan Note (Signed)
 Discussed importance of safe sexual practice and condom use. Condoms and site specific STD testing offered.  Vaccinations reviewed and declined Due for cervical cancer screening and will schedule appointment with GYN.  Routine dental care up to date.

## 2023-12-07 NOTE — Assessment & Plan Note (Signed)
 Monica Henson continues to have stress related to work and life.  Counseling offered.  She does have family support and does do for herself on occasion

## 2023-12-07 NOTE — Assessment & Plan Note (Signed)
 Monica Henson continues to have well-controlled virus with good adherence and tolerance to [employer].  Reviewed previous lab work and discussed plan of care, U equals U, and family-planning.  Check blood work.  Continue current dose of Biktarvy.  Co-pay card added to pharmacy for medication assistance.  Advised to contact pharmacy team if further assistance is needed.  Plan for follow-up in 6 months or sooner if needed with lab work on the same day.

## 2023-12-07 NOTE — Patient Instructions (Addendum)
 Nice to see you.  We will check your lab work today.  Continue to take your medication daily as prescribed.  Refills have been sent to the pharmacy.  Plan for follow up in 6 months or sooner if needed with lab work on the same day.  Have a great day and stay safe!  Electronic Cigarette Information  Electronic cigarettes, or e-cigarettes, are battery-operated devices that deliver nicotine--a very addictive drug--to the body. They come in many shapes, including in the shape of a cigarette, pipe, pen, and even a USB memory stick. Electronic cigarettes may be called e-cigs, e-hookahs, vape pens, and electronic nicotine delivery systems (ENDS). E-cigarettes have a cartridge that contains a liquid form of nicotine. When a person uses the device, the liquid heats up. It then becomes a vapor that a person inhales. Using e-cigarettes may be called vaping. Nicotine is thought to increase your risk for certain types of cancer. In addition to nicotine, e-cigarettes may contain other harmful and cancer-causing chemicals, including: Formaldehyde. Acetaldehyde. Heavy metals. Ultrafine particles that can get inhaled deep into the lungs. Chemical colorings and flavorings. It is not clear how much nicotine you get when vaping, and it is hard to know what chemicals are in the vaping liquids. The health effects of vaping are not completely known, but you should be aware of the possible dangers of using these products. Some people may use e-cigarettes to quit smoking tobacco. However, this has not been proven to work, and the Education officer, environmental (FDA) has not approved e-cigarettes for this purpose. How can using electronic cigarettes affect me? You may be at risk for developing a dangerous lung disease. There are reports of an increasing number of cases involving serious lung problems, and even death, associated with e-cigarette use. Your risk may be even higher if you: Buy e-cigarettes or vaping oils  off the street. Add any substances to the e-cigarettes that are not intended by the manufacturer. Vaping may make you crave nicotine. Nicotine does the following: Changes your blood sugar levels. Increases your heart rate, blood pressure, and breathing rate. Increases your risk of developing blood clots (hypercoagulable state) and diabetes. Increases your risk of gum disease that may lead to losing teeth. If you smoke e-cigarettes, you may be more likely to start smoking or to smoke more tobacco cigarettes. Becoming addicted to nicotine may make your brain more sensitive to other addictive drugs. You may move to other addictive substances. You may be in danger of overdosing on nicotine. Nicotine poisoning can cause nausea, vomiting, seizures, and trouble breathing. Vaping has also been linked to decreases in memory and attention span in children and teens. If you are pregnant, the nicotine in e-cigarettes may be harmful to your baby. Nicotine can cause: Brain or lung problems for your baby. Your baby to be born too early. Your baby to be born with a low birth weight. What actions can I take to stop vaping? If you can, stop vaping on your own before you become addicted to nicotine. If you need help quitting, ask your health care provider. There are three effective ways to fight nicotine addiction: Nicotine replacement therapy. Using nicotine gum or a nicotine patch blocks your craving for nicotine. Over time, you can reduce the amount of nicotine you use until you can stop using nicotine completely without having cravings. Prescription medicines approved to fight nicotine addiction. These stop nicotine cravings or block the effects of nicotine. Behavioral therapy. This may include: A self-help smoking cessation  program. Individual or group therapy. A smoking cessation support group. There are several national programs to help you quit smoking or vaping. These include: Text message programs,  such as SmokefreeTXT. Apps for mobile phones, including the free quitSTART app. Hotlines, such as 1-800-QUIT-NOW (919)298-6290). Where to find support You can get support at these sites: U.S. Department of Health and Human Services: smokefree.gov American Lung Association: www.lung.org Where to find more information Learn more about e-cigarettes from: [employer] on Drug Abuse: http://www.price-smith.com/ Centers for Disease Control and Prevention: FootballExhibition.com.br Summary E-cigarettes can cause nicotine addiction. E-cigarettes are not approved as a way to stop smoking. They are not a risk-free alternative to smoking tobacco. There are reports of an increasing number of cases involving serious lung problems, and even death, associated with e-cigarette use. If you can stop vaping on your own, do it before you become addicted to nicotine. If you need help quitting, ask your health care provider. There are various methods and programs that can help you stop smoking or vaping. This information is not intended to replace advice given to you by your health care provider. Make sure you discuss any questions you have with your health care provider. Document Revised: 07/03/2021 Document Reviewed: 07/03/2021 Elsevier Patient Education  2024 [employer].

## 2023-12-07 NOTE — Progress Notes (Signed)
 Specialty Pharmacy Initiation Note   Monica Henson is a 28 y.o. female who will be followed by the specialty pharmacy service for RxSp HIV    Review of administration, indication, effectiveness, safety, potential side effects, storage/disposable, and missed dose instructions occurred today for patient's specialty medication(s) Bictegravir-Emtricitab-Tenofov [facility])     Patient/Caregiver did not have any additional questions or concerns.   Patient's therapy is appropriate to: Continue    Goals Addressed             This Visit's Progress    Achieve Undetectable HIV Viral Load < 20       Patient is on track. Patient will maintain adherence      Increase CD4 count until steady state       Patient is on track. Patient will maintain adherence         Jennette Kettle Specialty Pharmacist

## 2023-12-07 NOTE — Telephone Encounter (Signed)
 [facility] Patient Advocate Encounter   Was successful in obtaining a [employer] copay card for [facility].  This copay card will make the patients copay $0.  I have spoken with the patient.    The billing information is as follows and has been shared with [facility].  RxBin: [id number] PCN: [id number] Member ID: [id number] Group ID: [id number]    Kae Heller, CPhT Specialty Pharmacy Patient [facility] Phone: [phone] Fax:  [phone]

## 2023-12-07 NOTE — Progress Notes (Signed)
 Brief Narrative   Patient ID: Monica Henson, female    DOB: 1996-10-06, 28 y.o.   MRN: [id number]  Ms. Kochel is a 28 y/o caucasian female with HIV-1 disease diagnosed on 05/04/18 with risk factor of heterosexual contact. Initial blood work with CD4 count of 250 and viral load of 4.96 million. Genotype with Subtype B and no significant resistance patterns. No history of opportunistic infection. Initial medication was Biktarvy. Entered care in CDC Stage 2.   Subjective:    Chief Complaint  Patient presents with   Follow-up    B20     HPI:  Monica Henson is a 28 y.o. female with HIV disease last seen on 06/01/2022 with well-controlled virus and good adherence and tolerance to [employer].  Viral load was undetectable with CD4 count 773.  Kidney function, liver function, electrolytes within normal ranges.  Here today for routine follow-up.  Monica Henson has been doing okay since her last office visit and continues to take [facility] as prescribed with no adverse side effects or problems obtaining medication from the pharmacy.  Not covered by [place] healthcare with co-pay card added to her pharmacy account.  Continues to work full-time in [employer].  Housing and access to food are stable.  Transportation via personal vehicle.  Raising her 81-year-old daughter with assistance of family.  Condoms and STD testing offered.  Healthcare maintenance reviewed.  Due for cervical cancer screening.  Dental care up-to-date per recommendation.  Has decreased sleep recently secondary to daughter being awake most of the night.  Has quit cigarette use and is now vaping.  Denies fevers, chills, night sweats, headaches, changes in vision, neck pain/stiffness, nausea, diarrhea, vomiting, lesions or rashes.  Lab Results  Component Value Date   CD4TCELL 43 06/02/2023   CD4TABS 773 06/02/2023   Lab Results  Component Value Date   HIV1RNAQUANT Not Detected 06/02/2023     No Known  Allergies    Outpatient Medications Prior to Visit  Medication Sig Dispense Refill   bictegravir-emtricitabine-tenofovir AF (BIKTARVY) 50-200-25 MG TABS tablet Take 1 tablet by mouth daily. 30 tablet 6   No facility-administered medications prior to visit.     Past Medical History:  Diagnosis Date   Acute calculous cholecystitis 05/14/2018   Anemia    Anorexia 05/13/2018   Elevated LFTs    HIV infection (HCC)    Malnutrition of moderate degree 05/13/2018     Past Surgical History:  Procedure Laterality Date   CYST REMOVAL HAND        Review of Systems  Constitutional:  Negative for appetite change, chills, diaphoresis, fatigue, fever and unexpected weight change.  Eyes:        Negative for acute change in vision  Respiratory:  Negative for chest tightness, shortness of breath and wheezing.   Cardiovascular:  Negative for chest pain.  Gastrointestinal:  Negative for diarrhea, nausea and vomiting.  Genitourinary:  Negative for dysuria, pelvic pain and vaginal discharge.  Musculoskeletal:  Negative for neck pain and neck stiffness.  Skin:  Negative for rash.  Neurological:  Negative for seizures, syncope, weakness and headaches.  Hematological:  Negative for adenopathy. Does not bruise/bleed easily.  Psychiatric/Behavioral:  Negative for hallucinations.       Objective:    BP 108/67   Pulse 70   Temp 98.1 F (36.7 C) (Oral)   Resp 16   Wt 121 lb 6.4 oz (55.1 kg)   SpO2 100%   BMI 19.59 kg/m  Nursing note  and vital signs reviewed.  Physical Exam Constitutional:      General: She is not in acute distress.    Appearance: She is well-developed.  Eyes:     Conjunctiva/sclera: Conjunctivae normal.  Cardiovascular:     Rate and Rhythm: Normal rate and regular rhythm.     Heart sounds: Normal heart sounds. No murmur heard.    No friction rub. No gallop.  Pulmonary:     Effort: Pulmonary effort is normal. No respiratory distress.     Breath sounds: Normal breath  sounds. No wheezing or rales.  Chest:     Chest wall: No tenderness.  Abdominal:     General: Bowel sounds are normal.     Palpations: Abdomen is soft.     Tenderness: There is no abdominal tenderness.  Musculoskeletal:     Cervical back: Neck supple.  Lymphadenopathy:     Cervical: No cervical adenopathy.  Skin:    General: Skin is warm and dry.     Findings: No rash.  Neurological:     Mental Status: She is alert and oriented to person, place, and time.  Psychiatric:        Behavior: Behavior normal.        Thought Content: Thought content normal.        Judgment: Judgment normal.         12/07/2023    9:52 AM 06/02/2023    9:26 AM 12/11/2022   10:40 AM 05/27/2022   10:58 AM 11/20/2021    2:03 PM  Depression screen PHQ 2/9  Decreased Interest 0 0 0 0 0  Down, Depressed, Hopeless 0 0 0 0 0  PHQ - 2 Score 0 0 0 0 0       Assessment & Plan:    Patient Active Problem List   Diagnosis Date Noted   Tobacco use 12/07/2023   Stress 12/11/2022   Healthcare maintenance 02/16/2020   Anemia, unspecified 05/13/2018   HIV disease (HCC) 05/07/2018     Problem List Items Addressed This Visit       Other   HIV disease (HCC) - Primary   Monica Henson continues to have well-controlled virus with good adherence and tolerance to [employer].  Reviewed previous lab work and discussed plan of care, U equals U, and family-planning.  Check blood work.  Continue current dose of Biktarvy.  Co-pay card added to pharmacy for medication assistance.  Advised to contact pharmacy team if further assistance is needed.  Plan for follow-up in 6 months or sooner if needed with lab work on the same day.      Relevant Medications   bictegravir-emtricitabine-tenofovir AF (BIKTARVY) 50-200-25 MG TABS tablet   Other Relevant Orders   COMPLETE METABOLIC PANEL WITH GFR   HIV-1 RNA quant-no reflex-bld   T-helper cell (CD4)- ([facility] clinic only)   Healthcare maintenance   Discussed importance of safe sexual  practice and condom use. Condoms and site specific STD testing offered.  Vaccinations reviewed and declined Due for cervical cancer screening and will schedule appointment with GYN.  Routine dental care up to date.       Stress   Ms. Poythress continues to have stress related to work and life.  Counseling offered.  She does have family support and does do for herself on occasion      Tobacco use   This point is currently vaping with a cartridge lasting about 2 to 3 weeks.  Discussed importance of tobacco cessation to reduce risk of  disease development and complications in the future.  Information provided in after visit summary.  Not ready to quit at this time.        I am having Estill Dooms maintain her Biktarvy.   Meds ordered this encounter  Medications   bictegravir-emtricitabine-tenofovir AF (BIKTARVY) 50-200-25 MG TABS tablet    Sig: Take 1 tablet by mouth daily.    Dispense:  30 tablet    Refill:  6    Please mail    Supervising Provider:   Judyann Munson [[id number]]     Follow-up: Return in about 6 months (around 06/05/2024). or sooner if needed.    Marcos Eke, MSN, FNP-C Nurse Practitioner [facility] for [facility]ious Disease [facility] Main number: [phone]

## 2023-12-08 LAB — T-HELPER CELL (CD4) - (RCID CLINIC ONLY)
CD4 % Helper T Cell: 41 % (ref 33–65)
CD4 T Cell Abs: 1086 /uL (ref 400–1790)

## 2023-12-09 LAB — HIV-1 RNA QUANT-NO REFLEX-BLD
HIV 1 RNA Quant: <20 {copies}/mL — ABNORMAL HIGH
HIV-1 RNA Quant, Log: <1.3 {Log} — ABNORMAL HIGH

## 2023-12-09 LAB — COMPLETE METABOLIC PANEL WITH GFR
AG Ratio: 2 (calc) (ref 1.0–2.5)
ALT: 12 U/L (ref 6–29)
AST: 12 U/L (ref 10–30)
Albumin: 4.7 g/dL (ref 3.6–5.1)
Alkaline phosphatase (APISO): 50 U/L (ref 31–125)
BUN: 11 mg/dL (ref 7–25)
CO2: 24 mmol/L (ref 20–32)
Calcium: 9.1 mg/dL (ref 8.6–10.2)
Chloride: 108 mmol/L (ref 98–110)
Creat: 0.95 mg/dL (ref 0.50–0.96)
Globulin: 2.3 g/dL (ref 1.9–3.7)
Glucose, Bld: 85 mg/dL (ref 65–99)
Potassium: 4.1 mmol/L (ref 3.5–5.3)
Sodium: 140 mmol/L (ref 135–146)
Total Bilirubin: 0.4 mg/dL (ref 0.2–1.2)
Total Protein: 7 g/dL (ref 6.1–8.1)
eGFR: 84 mL/min/{1.73_m2} (ref >=60)

## 2023-12-24 ENCOUNTER — Other Ambulatory Visit: Payer: Self-pay

## 2023-12-30 ENCOUNTER — Other Ambulatory Visit (HOSPITAL_COMMUNITY): Payer: Self-pay

## 2023-12-31 ENCOUNTER — Other Ambulatory Visit: Payer: Self-pay

## 2023-12-31 ENCOUNTER — Other Ambulatory Visit (HOSPITAL_COMMUNITY): Payer: Self-pay

## 2023-12-31 NOTE — Progress Notes (Signed)
 Specialty Pharmacy Refill Coordination Note  Rilynne Lonsway is a 28 y.o. female contacted today regarding refills of specialty medication(s) No data recorded  Patient requested (Patient-Rptd) Delivery   Delivery date: (Patient-Rptd) 01/02/24   Verified address: (Patient-Rptd) [address] [place], [place] [address]   Medication will be filled on 01/01/24. New delivery date is 01/04/24. Patient has been notified.

## 2024-02-01 ENCOUNTER — Other Ambulatory Visit: Payer: Self-pay

## 2024-02-09 ENCOUNTER — Other Ambulatory Visit (HOSPITAL_COMMUNITY): Payer: Self-pay

## 2024-02-17 ENCOUNTER — Other Ambulatory Visit (HOSPITAL_COMMUNITY): Payer: Self-pay | Admitting: Pharmacy Technician

## 2024-02-17 ENCOUNTER — Other Ambulatory Visit (HOSPITAL_COMMUNITY): Payer: Self-pay

## 2024-02-17 NOTE — Progress Notes (Signed)
 Specialty Pharmacy Refill Coordination Note  Monica Henson is a 28 y.o. female contacted today regarding refills of specialty medication(s) Bictegravir-Emtricitab-Tenofov (Biktarvy )   Patient requested Delivery   Delivery date: 02/19/24   Verified address: [address] [place], [place], [place] [address]   Medication will be filled on 02/18/24.

## 2024-02-17 NOTE — Progress Notes (Signed)
 Specialty Pharmacy Refill Coordination Note  Monica Henson is a 28 y.o. female contacted today regarding refills of specialty medication(s) No data recorded  Patient requested Delivery   Delivery date: 02/19/24   Verified address: [address] [place], [place], [place] [address]   Medication will be filled on 02/18/24.

## 2024-03-08 ENCOUNTER — Other Ambulatory Visit: Payer: Self-pay

## 2024-03-10 ENCOUNTER — Other Ambulatory Visit: Payer: Self-pay

## 2024-03-15 ENCOUNTER — Other Ambulatory Visit: Payer: Self-pay

## 2024-03-15 ENCOUNTER — Other Ambulatory Visit (HOSPITAL_COMMUNITY): Payer: Self-pay

## 2024-04-04 ENCOUNTER — Other Ambulatory Visit (HOSPITAL_COMMUNITY): Payer: Self-pay

## 2024-04-04 ENCOUNTER — Other Ambulatory Visit: Payer: Self-pay

## 2024-04-04 NOTE — Progress Notes (Signed)
 Specialty Pharmacy Refill Coordination Note  MyChart Questionnaire Submission   Monica Henson is a 28 y.o. female contacted today regarding refills of specialty medication(s) Biktarvy .  Patient requested Delivery   Delivery date: 04/05/24  Verified address: (Patient-Rptd) [address] [place], [place], [place] [address]  Medication will be filled on 04/04/24.

## 2024-04-21 ENCOUNTER — Other Ambulatory Visit: Payer: Self-pay

## 2024-04-26 ENCOUNTER — Other Ambulatory Visit (HOSPITAL_COMMUNITY): Payer: Self-pay

## 2024-04-28 ENCOUNTER — Other Ambulatory Visit: Payer: Self-pay

## 2024-05-05 ENCOUNTER — Other Ambulatory Visit: Payer: Self-pay

## 2024-05-10 ENCOUNTER — Other Ambulatory Visit: Payer: Self-pay

## 2024-05-11 ENCOUNTER — Other Ambulatory Visit: Payer: Self-pay

## 2024-05-11 NOTE — Progress Notes (Signed)
 Specialty Pharmacy Refill Coordination Note  Monica Henson is a 28 y.o. female contacted today regarding refills of specialty medication(s) Bictegravir-Emtricitab-Tenofov (Biktarvy )   Patient requested Delivery   Delivery date: 05/12/24   Verified address: [address] [place] [address]   Medication will be filled on 05/11/24.

## 2024-05-24 ENCOUNTER — Other Ambulatory Visit: Payer: Self-pay

## 2024-05-30 ENCOUNTER — Other Ambulatory Visit: Payer: Self-pay

## 2024-05-30 ENCOUNTER — Encounter: Payer: Self-pay | Admitting: Family

## 2024-05-30 ENCOUNTER — Ambulatory Visit: Payer: 59 | Admitting: Family

## 2024-05-30 VITALS — BP 103/61 | HR 89 | Temp 98.1°F | Ht 66.0 in | Wt 121.0 lb

## 2024-05-30 DIAGNOSIS — B20 Human immunodeficiency virus [HIV] disease: Secondary | ICD-10-CM

## 2024-05-30 DIAGNOSIS — Z720 Tobacco use: Secondary | ICD-10-CM

## 2024-05-30 DIAGNOSIS — Z79899 Other long term (current) drug therapy: Secondary | ICD-10-CM

## 2024-05-30 DIAGNOSIS — Z0000 Encounter for general adult medical examination without abnormal findings: Secondary | ICD-10-CM

## 2024-05-30 MED ORDER — BIKTARVY 50-200-25 MG PO TABS
1.0000 | ORAL_TABLET | Freq: Every day | ORAL | 6 refills | Status: DC
Start: 2024-05-30 — End: 2024-06-30
  Filled 2024-05-30 – 2024-06-03 (×2): qty 30, 30d supply, fill #0

## 2024-05-30 NOTE — Assessment & Plan Note (Signed)
 Recently stopped vaping and not currently smoking. Encouraged to continue with tobacco cessation.

## 2024-05-30 NOTE — Assessment & Plan Note (Signed)
 Monica Henson continues to have well controlled virus with good adherence and tolerance to Biktarvy .  Reviewed lab work and discussed plan of care, U equals U, and family planning. Social determinants of health reviewed and no interventions indicated. Check lab work. Continue current dose of Biktarvy . Plan for follow up in  6 months or sooner if needed with lab work on the same day.SABRA

## 2024-05-30 NOTE — Progress Notes (Signed)
 Brief Narrative   Patient ID: Monica Henson, female    DOB: 1996/06/03, 28 y.o.   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 Initial blood work with CD4 count of 250 and viral load of 4.96 million. Genotype with Subtype B and no significant resistance patterns. No history of opportunistic infection. Initial medication was Biktarvy . Entered care in [facility] Stage 2.   Subjective:   Chief Complaint  Patient presents with   Follow-up    HPI:  Monica Henson is a 28 y.o. female with HIV disease last seen on 12/07/2023 with well-controlled virus and good adherence and tolerance to Biktarvy .  Viral load was undetectable with CD4 count 1086.  Kidney function, liver function, electrolytes within normal ranges.  Here today for routine follow-up.  Monica Henson has been doing well since her last office visit and continues to take Biktarvy  as prescribed with no adverse side effects or problems obtaining medication from the pharmacy.  Currently covered by [place] [employer].  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  No new concerns/complaints.  Housing, transportation, and access to food are stable.  Healthcare maintenance reviewed.  Condoms and site-specific STD testing offered. Was vaping and has since stopped secondary to product availability.   Denies fevers, chills, night sweats, headaches, changes in vision, neck pain/stiffness, nausea, diarrhea, vomiting, lesions or rashes.  Lab Results  Component Value Date   CD4TCELL 41 12/07/2023   CD4TABS 1,086 12/07/2023   Lab Results  Component Value Date   HIV1RNAQUANT <20 (H) 12/07/2023     No Known Allergies    Outpatient Medications Prior to Visit  Medication Sig Dispense Refill   Desogestrel-Ethinyl Estradiol (ISIBLOOM PO) Take by mouth.     bictegravir-emtricitabine -tenofovir  AF (BIKTARVY )  50-200-25 MG TABS tablet Take 1 tablet by mouth daily. 30 tablet 6   No facility-administered medications prior to visit.     Past Medical History:  Diagnosis Date   Acute calculous cholecystitis 05/14/2018   Anemia    Anorexia 05/13/2018   Elevated LFTs    HIV infection (HCC)    Malnutrition of moderate degree 05/13/2018     Past Surgical History:  Procedure Laterality Date   CYST REMOVAL HAND          Review of Systems  Constitutional:  Negative for appetite change, chills, diaphoresis, fatigue, fever and unexpected weight change.  Eyes:        Negative for acute change in vision  Respiratory:  Negative for chest tightness, shortness of breath and wheezing.   Cardiovascular:  Negative for chest pain.  Gastrointestinal:  Negative for diarrhea, nausea and vomiting.  Genitourinary:  Negative for dysuria, pelvic pain and vaginal discharge.  Musculoskeletal:  Negative for neck pain and neck stiffness.  Skin:  Negative for rash.  Neurological:  Negative for seizures, syncope, weakness and headaches.  Hematological:  Negative for adenopathy. Does not bruise/bleed easily.  Psychiatric/Behavioral:  Negative for hallucinations.      Objective:   BP 103/61   Pulse 89   Temp 98.1 F (36.7 C) (Temporal)   Ht 5' 6 (1.676 m)   Wt 121 lb (54.9 kg)   SpO2 100%   BMI 19.53 kg/m  Nursing note and vital signs reviewed.  Physical Exam Constitutional:      General: She is not in acute distress.    Appearance: She is well-developed.  Eyes:     Conjunctiva/sclera: Conjunctivae normal.  Cardiovascular:     Rate and Rhythm: Normal rate and regular rhythm.     Heart sounds: Normal heart sounds. No murmur heard.    No friction rub. No gallop.  Pulmonary:     Effort: Pulmonary effort is normal. No respiratory distress.     Breath sounds: Normal breath sounds. No wheezing or rales.  Chest:     Chest wall: No tenderness.  Abdominal:     General: Bowel sounds are normal.      Palpations: Abdomen is soft.     Tenderness: There is no abdominal tenderness.  Musculoskeletal:     Cervical back: Neck supple.  Lymphadenopathy:     Cervical: No cervical adenopathy.  Skin:    General: Skin is warm and dry.     Findings: No rash.  Neurological:     Mental Status: She is alert and oriented to person, place, and time.  Psychiatric:        Mood and Affect: Mood normal.          12/07/2023    9:52 AM 06/02/2023    9:26 AM 12/11/2022   10:40 AM 05/27/2022   10:58 AM 11/20/2021    2:03 PM  Depression screen PHQ 2/9  Decreased Interest 0 0 0 0 0  Down, Depressed, Hopeless 0 0 0 0 0  PHQ - 2 Score 0 0 0 0 0         No data to display           The ASCVD Risk score (Arnett DK, et al., 2019) failed to calculate for the following reasons:   The 2019 ASCVD risk score is only valid for ages 52 to 81      Assessment & Plan:    Patient Active Problem List   Diagnosis Date Noted   Tobacco use 12/07/2023   Stress 12/11/2022   Healthcare maintenance 02/16/2020   Anemia, unspecified 05/13/2018   HIV disease (HCC) 05/07/2018     Problem List Items Addressed This Visit       Other   HIV disease (HCC) - Primary   Monica Henson continues to have well controlled virus with good adherence and tolerance to Biktarvy .  Reviewed lab work and discussed plan of care, U equals U, and family planning. Social determinants of health reviewed and no interventions indicated. Check lab work. Continue current dose of Biktarvy . Plan for follow up in  6 months or sooner if needed with lab work on the same day..       Relevant Medications   bictegravir-emtricitabine -tenofovir  AF (BIKTARVY ) 50-200-25 MG TABS tablet   Other Relevant Orders   Comprehensive metabolic panel with GFR   HIV-1 RNA quant-no reflex-bld   T-helper cell (CD4)- ([facility] clinic only)   Healthcare maintenance   Discussed importance of safe sexual practice and condom use. Condoms and site specific STD testing offered.   Vaccinations reviewed and declined following counseling.  Reminded to schedule appointment with GYN for cervical cancer screening. Dental care up to date.       Tobacco use   Recently stopped vaping and not currently smoking. Encouraged to continue with tobacco cessation.         I am having Monica Henson maintain her Desogestrel-Ethinyl Estradiol (ISIBLOOM PO) and Biktarvy .   Meds ordered this encounter  Medications   bictegravir-emtricitabine -tenofovir  AF (BIKTARVY ) 50-200-25 MG TABS tablet    Sig: Take 1 tablet by mouth daily.  Dispense:  30 tablet    Refill:  6    Please mail    Supervising Provider:   LUIZ CHANNEL [phone]    Prescription Type::   Renewal     Follow-up: Return in about 6 months (around 11/30/2024). or sooner if needed.    Cathlyn July, MSN, FNP-C Nurse Practitioner [facility] Main number: [phone]

## 2024-05-30 NOTE — Assessment & Plan Note (Signed)
 Discussed importance of safe sexual practice and condom use. Condoms and site specific STD testing offered.  Vaccinations reviewed and declined following counseling.  Reminded to schedule appointment with GYN for cervical cancer screening. Dental care up to date.

## 2024-05-30 NOTE — Patient Instructions (Addendum)
 Nice to see you. ? ?We will check your lab work today. ? ?Continue to take your medication daily as prescribed. ? ?Refills have been sent to the pharmacy. ? ?Plan for follow up in 6 months or sooner if needed with lab work on the same day. ? ?Have a great day and stay safe! ? ?

## 2024-05-31 LAB — T-HELPER CELL (CD4) - (RCID CLINIC ONLY)
CD4 % Helper T Cell: 41 % (ref 33–65)
CD4 T Cell Abs: 712 /uL (ref 400–1790)

## 2024-06-01 LAB — COMPREHENSIVE METABOLIC PANEL WITH GFR
AG Ratio: 1.8 (calc) (ref 1.0–2.5)
ALT: 9 U/L (ref 6–29)
AST: 12 U/L (ref 10–30)
Albumin: 4.5 g/dL (ref 3.6–5.1)
Alkaline phosphatase (APISO): 41 U/L (ref 31–125)
BUN: 11 mg/dL (ref 7–25)
CO2: 25 mmol/L (ref 20–32)
Calcium: 9.2 mg/dL (ref 8.6–10.2)
Chloride: 107 mmol/L (ref 98–110)
Creat: 0.92 mg/dL (ref 0.50–0.96)
Globulin: 2.5 g/dL (ref 1.9–3.7)
Glucose, Bld: 76 mg/dL (ref 65–99)
Potassium: 3.8 mmol/L (ref 3.5–5.3)
Sodium: 139 mmol/L (ref 135–146)
Total Bilirubin: 0.4 mg/dL (ref 0.2–1.2)
Total Protein: 7 g/dL (ref 6.1–8.1)
eGFR: 88 mL/min/1.73m2 (ref >=60)

## 2024-06-01 LAB — HIV-1 RNA QUANT-NO REFLEX-BLD
HIV 1 RNA Quant: NOT DETECTED {copies}/mL
HIV-1 RNA Quant, Log: NOT DETECTED {Log_copies}/mL

## 2024-06-03 ENCOUNTER — Other Ambulatory Visit: Payer: Self-pay

## 2024-06-03 ENCOUNTER — Other Ambulatory Visit: Payer: Self-pay | Admitting: Pharmacy Technician

## 2024-06-06 ENCOUNTER — Other Ambulatory Visit (HOSPITAL_COMMUNITY): Payer: Self-pay

## 2024-06-06 ENCOUNTER — Other Ambulatory Visit: Payer: Self-pay

## 2024-06-07 ENCOUNTER — Other Ambulatory Visit: Payer: Self-pay

## 2024-06-07 ENCOUNTER — Ambulatory Visit: Payer: Self-pay | Admitting: Family

## 2024-06-09 ENCOUNTER — Other Ambulatory Visit (HOSPITAL_COMMUNITY): Payer: Self-pay

## 2024-06-10 ENCOUNTER — Other Ambulatory Visit: Payer: Self-pay

## 2024-06-14 ENCOUNTER — Other Ambulatory Visit: Payer: Self-pay

## 2024-06-23 ENCOUNTER — Other Ambulatory Visit (HOSPITAL_COMMUNITY): Payer: Self-pay

## 2024-06-30 ENCOUNTER — Telehealth: Payer: Self-pay

## 2024-06-30 DIAGNOSIS — B20 Human immunodeficiency virus [HIV] disease: Secondary | ICD-10-CM

## 2024-06-30 MED ORDER — BIKTARVY 50-200-25 MG PO TABS: 1.0000 | ORAL_TABLET | Freq: Every day | ORAL | 5 refills | Status: AC

## 2024-06-30 NOTE — Telephone Encounter (Signed)
 Per financial counselor, Biktarvy  will need to be sent to [facility].   Mariany Mackintosh, BSN, RN

## 2024-07-06 ENCOUNTER — Other Ambulatory Visit: Payer: Self-pay

## 2024-09-01 ENCOUNTER — Other Ambulatory Visit: Payer: Self-pay

## 2024-09-01 NOTE — Progress Notes (Signed)
 Patient's insurance required her to fill at [facility].  Disenrolling from Specialty Pharmacy services.

## 2024-12-05 ENCOUNTER — Ambulatory Visit: Admitting: Family
